# Patient Record
Sex: Female | Born: 1935 | Race: White | Hispanic: No | Smoking: Never smoker
Health system: Southern US, Community
[De-identification: ages and names within clinical notes are randomized; demographics above are authoritative.]

## PROBLEM LIST (undated history)

## (undated) DIAGNOSIS — I10 Essential (primary) hypertension: Secondary | ICD-10-CM

## (undated) DIAGNOSIS — C8300 Small cell B-cell lymphoma, unspecified site: Secondary | ICD-10-CM

## (undated) DIAGNOSIS — I4891 Unspecified atrial fibrillation: Secondary | ICD-10-CM

## (undated) DIAGNOSIS — I509 Heart failure, unspecified: Secondary | ICD-10-CM

## (undated) DIAGNOSIS — E785 Hyperlipidemia, unspecified: Secondary | ICD-10-CM

## (undated) HISTORY — DX: Essential (primary) hypertension: I10

## (undated) HISTORY — DX: Unspecified atrial fibrillation: I48.91

## (undated) HISTORY — DX: Small cell B-cell lymphoma, unspecified site: C83.00

## (undated) HISTORY — DX: Hyperlipidemia, unspecified: E78.5

## (undated) HISTORY — DX: Heart failure, unspecified: I50.9

## (undated) HISTORY — PX: GALLBLADDER SURGERY: SHX652

---

## 2006-11-03 ENCOUNTER — Other Ambulatory Visit: Payer: Self-pay

## 2006-11-03 ENCOUNTER — Inpatient Hospital Stay: Payer: Self-pay | Admitting: Internal Medicine

## 2006-11-04 ENCOUNTER — Encounter: Payer: Self-pay | Admitting: Cardiovascular Disease

## 2006-11-06 ENCOUNTER — Other Ambulatory Visit: Payer: Self-pay

## 2006-11-07 ENCOUNTER — Encounter: Payer: Self-pay | Admitting: Cardiovascular Disease

## 2007-01-30 ENCOUNTER — Ambulatory Visit: Payer: Self-pay | Admitting: Internal Medicine

## 2007-03-30 ENCOUNTER — Emergency Department: Payer: Self-pay | Admitting: Emergency Medicine

## 2008-02-03 ENCOUNTER — Ambulatory Visit: Payer: Self-pay | Admitting: Internal Medicine

## 2009-10-27 ENCOUNTER — Encounter: Payer: Self-pay | Admitting: Cardiovascular Disease

## 2009-11-10 ENCOUNTER — Encounter: Payer: Self-pay | Admitting: Cardiovascular Disease

## 2009-11-14 ENCOUNTER — Encounter: Payer: Self-pay | Admitting: Cardiovascular Disease

## 2009-11-18 DIAGNOSIS — I4891 Unspecified atrial fibrillation: Secondary | ICD-10-CM

## 2009-11-18 DIAGNOSIS — I2510 Atherosclerotic heart disease of native coronary artery without angina pectoris: Secondary | ICD-10-CM | POA: Insufficient documentation

## 2009-11-18 DIAGNOSIS — I10 Essential (primary) hypertension: Secondary | ICD-10-CM | POA: Insufficient documentation

## 2009-11-18 DIAGNOSIS — E785 Hyperlipidemia, unspecified: Secondary | ICD-10-CM

## 2009-12-01 ENCOUNTER — Encounter: Payer: Self-pay | Admitting: Cardiovascular Disease

## 2009-12-02 ENCOUNTER — Ambulatory Visit: Payer: Self-pay | Admitting: Cardiovascular Disease

## 2009-12-06 ENCOUNTER — Encounter: Payer: Self-pay | Admitting: Cardiovascular Disease

## 2009-12-06 LAB — CONVERTED CEMR LAB: Prothrombin Time: 27.6 s

## 2010-01-03 ENCOUNTER — Telehealth: Payer: Self-pay | Admitting: Cardiovascular Disease

## 2010-01-03 ENCOUNTER — Encounter: Payer: Self-pay | Admitting: Cardiovascular Disease

## 2010-01-10 ENCOUNTER — Encounter: Payer: Self-pay | Admitting: Cardiovascular Disease

## 2010-01-11 ENCOUNTER — Telehealth: Payer: Self-pay | Admitting: Cardiovascular Disease

## 2010-01-11 ENCOUNTER — Encounter: Payer: Self-pay | Admitting: Cardiovascular Disease

## 2010-01-11 LAB — CONVERTED CEMR LAB: POC INR: 2.1

## 2010-01-26 ENCOUNTER — Encounter: Payer: Self-pay | Admitting: Cardiovascular Disease

## 2010-01-27 ENCOUNTER — Encounter: Payer: Self-pay | Admitting: Cardiovascular Disease

## 2010-02-06 ENCOUNTER — Encounter (INDEPENDENT_AMBULATORY_CARE_PROVIDER_SITE_OTHER): Payer: Self-pay

## 2010-02-08 ENCOUNTER — Encounter: Payer: Self-pay | Admitting: Cardiovascular Disease

## 2010-02-17 ENCOUNTER — Encounter: Payer: Self-pay | Admitting: Cardiovascular Disease

## 2010-02-20 ENCOUNTER — Encounter: Payer: Self-pay | Admitting: Cardiovascular Disease

## 2010-03-03 ENCOUNTER — Encounter: Payer: Self-pay | Admitting: Cardiovascular Disease

## 2010-03-07 ENCOUNTER — Encounter: Payer: Self-pay | Admitting: Cardiovascular Disease

## 2010-03-07 ENCOUNTER — Telehealth: Payer: Self-pay | Admitting: Cardiovascular Disease

## 2010-03-15 ENCOUNTER — Telehealth: Payer: Self-pay | Admitting: Cardiovascular Disease

## 2010-03-17 ENCOUNTER — Encounter (INDEPENDENT_AMBULATORY_CARE_PROVIDER_SITE_OTHER): Payer: Self-pay | Admitting: *Deleted

## 2010-03-21 ENCOUNTER — Encounter: Payer: Self-pay | Admitting: Cardiovascular Disease

## 2010-03-23 ENCOUNTER — Telehealth: Payer: Self-pay | Admitting: Cardiovascular Disease

## 2010-04-12 ENCOUNTER — Encounter: Payer: Self-pay | Admitting: Cardiovascular Disease

## 2010-05-29 ENCOUNTER — Encounter: Payer: Self-pay | Admitting: Cardiovascular Disease

## 2010-06-06 ENCOUNTER — Ambulatory Visit: Payer: Self-pay | Admitting: Internal Medicine

## 2010-07-03 ENCOUNTER — Ambulatory Visit: Payer: Self-pay | Admitting: Cardiovascular Disease

## 2010-10-03 NOTE — Medication Information (Signed)
Summary: Coumadin Clinic  Anticoagulant Therapy  Managed by: Inactive Referring MD: Rockey Situ PCP: Melina Modena, MD Supervising MD: Rockey Situ Indication 1: Atrial Fibrillation Lab Used: [facility] [place] Site: [place] INR RANGE 2.0-3.0          Comments: Pt is now having coumadin monitored at her primary MD Dr Milagros Evener office. Freddrick March RN  April 12, 2010 2:11 PM   Allergies: 1)  ! Pcn  Anticoagulation Management History:      Positive risk factors for bleeding include an age of 75 years or older.  The bleeding index is 'intermediate risk'.  Positive CHADS2 values include History of HTN.  Negative CHADS2 values include Age > 67 years old.  Anticoagulation responsible provider: [place].    Anticoagulation Management Assessment/Plan:      The patient's current anticoagulation dose is Warfarin sodium 6 mg tabs: Use as directed by Anticoagulation Clinic, Warfarin sodium 1 mg tabs: take as directed.  The target INR is 2.0-3.0.  The next INR is due 03/21/2010.  Anticoagulation instructions were given to patient.  Results were reviewed/authorized by Inactive.         Prior Anticoagulation Instructions: INR 2.6  Continue taking 3mg  daily except 2mg  on Monday and Friday. Recheck in 2 weeks. Attempted to call pt with results. LMOM TCB Cordelia Pen, RN  March 07, 2010 12:16 PM   Called and spoke to pt, she is aware of results dose of coumadin recheck in 2 weeks. Pt does not wish to have her coumadin checked here in office.  Asked her to call PCP Dr. Arline Asp to take over her coumadin monitoring.  She will call his office and let us know so we can fax our records to them.

## 2010-10-03 NOTE — Progress Notes (Signed)
Summary: PHI  PHI   Imported By: Zenovia Jarred 12/05/2009 13:36:48  _____________________________________________________________________  External Attachment:    Type:   Image     Comment:   External Document

## 2010-10-03 NOTE — Medication Information (Signed)
Summary: Coumadin Clinic  Anticoagulant Therapy  Managed by: Freddrick March, RN, BSN Referring MD: Rockey Situ PCP: Melina Modena, MD Supervising MD: Rockey Situ Indication 1: Atrial Fibrillation Lab Used: [facility] [place] Site: [place] PT 22.5 INR POC 2.1 INR RANGE 2.0-3.0    Bleeding/hemorrhagic complications: no     Any changes in medication regimen? no     Any missed doses?: no        Comments: Pt states she has been taking 4mg  daily, since resumed after skipped doses.    Allergies: 1)  ! Pcn  Anticoagulation Management History:      Her anticoagulation is being managed by telephone today.  Positive risk factors for bleeding include an age of 75 years or older.  The bleeding index is 'intermediate risk'.  Positive CHADS2 values include History of HTN.  Negative CHADS2 values include Age > 3 years old.  Prothrombin time is 22.5.  Anticoagulation responsible provider: Makena Murdock.  INR POC: 2.1.    Anticoagulation Management Assessment/Plan:      The patient's current anticoagulation dose is Warfarin sodium 6 mg tabs: Use as directed by Anticoagulation Clinic.  The target INR is 2.0-3.0.  The next INR is due 01/25/2010.  Anticoagulation instructions were given to patient.  Results were reviewed/authorized by Freddrick March, RN, BSN.  She was notified by Freddrick March RN.         Prior Anticoagulation Instructions: INR 6.4  Spoke with pt.  Advised to hold coumadin x 3 doses, then decrease dosage to 4mg  daily except 3mg  on Mondays and Thursdays.  Recheck in 1 week.    Current Anticoagulation Instructions: INR 2.1  Attempted to call pt with results.  N/A Freddrick March RN  Jan 11, 2010 3:47 PM  Spoke with pt advised to start taking 4mg  daily except 3mg  on Mondays and Fridays.  Recheck in 2 weeks.

## 2010-10-03 NOTE — Medication Information (Signed)
Summary: Coumadin Clinic  Anticoagulant Therapy  Managed by: Freddrick March, RN, BSN Referring MD: Rockey Situ PCP: Melina Modena, MD Supervising MD: Rockey Situ Indication 1: Atrial Fibrillation Lab Used: [facility] [place] Site: [place] PT 67.9 INR POC 6.4 INR RANGE 2.0-3.0  Dietary changes: no    Health status changes: no    Bleeding/hemorrhagic complications: yes       Details: Incr bruising, no signs of sctive bleeding.  Recent/future hospitalizations: no    Any changes in medication regimen? no    Recent/future dental: no  Any missed doses?: no       Is patient compliant with meds? yes      Comments: Pt denies any recent illness, or medication changes.  No significant changes in pt's diet per pt report.    Allergies: 1)  ! Pcn  Anticoagulation Management History:      Her anticoagulation is being managed by telephone today.  Positive risk factors for bleeding include an age of 75 years or older.  The bleeding index is 'intermediate risk'.  Positive CHADS2 values include History of HTN.  Negative CHADS2 values include Age > 55 years old.  Prothrombin time is 67.9.  Anticoagulation responsible provider: Meredith Mells.  INR POC: 6.4.    Anticoagulation Management Assessment/Plan:      The patient's current anticoagulation dose is Warfarin sodium 6 mg tabs: Use as directed by Anticoagulation Clinic.  The target INR is 2.0-3.0.  The next INR is due 01/10/2010.  Anticoagulation instructions were given to patient.  Results were reviewed/authorized by Freddrick March, RN, BSN.  She was notified by Freddrick March RN.         Prior Anticoagulation Instructions: The patient is to continue with the same dose of coumadin.  This dosage includes: 4 mg daily  Current Anticoagulation Instructions: INR 6.4  Spoke with pt.  Advised to hold coumadin x 3 doses, then decrease dosage to 4mg  daily except 3mg  on Mondays and Thursdays.  Recheck in 1 week.

## 2010-10-03 NOTE — Letter (Signed)
Summary: Medical Record Release  Medical Record Release   Imported By: Zenovia Jarred 11/25/2009 08:38:50  _____________________________________________________________________  External Attachment:    Type:   Image     Comment:   External Document

## 2010-10-03 NOTE — Progress Notes (Signed)
Summary: Results  Phone Note Call from Patient Call back at Home Phone [phone]   Caller: Patient Call For: RN Summary of Call: Patient called wanting to get her results from her [place]. Initial call taken by: Shanda Howells,  March 07, 2010 2:48 PM  Follow-up for Phone Call        pt called and coumadin dosed  Follow-up by: Cordelia Pen, RN,  March 07, 2010 4:21 PM

## 2010-10-03 NOTE — Progress Notes (Signed)
Summary: Simcor  Phone Note Outgoing Call   Call placed by: Cordelia Pen, RN,  March 23, 2010 9:06 AM Call placed to: Patient Summary of Call: Insurance company will not pay for simcor any longer Dr. Rockey Situ wants to change to simvastatin 40mg  daily. LMOM TCB Cordelia Pen, RN  March 23, 2010 9:07 AM      Appended Document: [facility]    Clinical Lists Changes  Medications: Changed medication from [facility] 500-20 MG XR24H-TAB (NIACIN-SIMVASTATIN) 2 by mouth once daily to SIMVASTATIN 40 MG TABS (SIMVASTATIN) Take 1 tablet by mouth once a day - Signed Rx of SIMVASTATIN 40 MG TABS (SIMVASTATIN) Take 1 tablet by mouth once a day;  #30 x 6;  Signed;  Entered by: Cordelia Pen, RN;  Authorized by: Esmond Plants MD;  Method used: Electronically to [facility] [address] [address] [place], [place], [place]  [address], Ph: [phone], Fax: [phone]    Prescriptions: SIMVASTATIN 40 MG TABS (SIMVASTATIN) Take 1 tablet by mouth once a day  #30 x 6   Entered by:   Cordelia Pen, RN   Authorized by:   Esmond Plants MD   Signed by:   Cordelia Pen, RN on 03/24/2010   Method used:   Electronically to        [facility]retail)       [address]       [place], [place]  [address]       Ph: [phone]       Fax: [phone]   RxID:   [id number]

## 2010-10-03 NOTE — Progress Notes (Signed)
Summary: COUMADIN  Phone Note Call from Patient Call back at Home Phone [phone]   Caller: SELF Call For: [facility] Summary of Call: [place] [facility] Initial call taken by: Zenovia Jarred,  Jan 11, 2010 4:04 PM

## 2010-10-03 NOTE — Letter (Signed)
Summary: [facility]   Imported By: Sallee Provencal 07/11/2010 15:55:24  _____________________________________________________________________  External Attachment:    Type:   Image     Comment:   External Document

## 2010-10-03 NOTE — Consult Note (Signed)
Summary: [facility]   Imported By: Sallee Provencal 07/11/2010 15:52:24  _____________________________________________________________________  External Attachment:    Type:   Image     Comment:   External Document

## 2010-10-03 NOTE — Medication Information (Signed)
Summary: Coumadin Clinic  Anticoagulant Therapy  Managed by: Freddrick March, RN, BSN Referring MD: Rockey Situ PCP: Melina Modena, MD Supervising MD: Rockey Situ Indication 1: Atrial Fibrillation Lab Used: [facility] [place] Site: [place] PT 55.7 INR POC 5.2 INR RANGE 2.0-3.0  Dietary changes: no    Health status changes: no    Bleeding/hemorrhagic complications: no    Recent/future hospitalizations: no    Any changes in medication regimen? no    Recent/future dental: no  Any missed doses?: no       Is patient compliant with meds? yes       Allergies: 1)  ! Pcn  Anticoagulation Management History:      Positive risk factors for bleeding include an age of 75 years or older.  The bleeding index is 'intermediate risk'.  Positive CHADS2 values include History of HTN.  Negative CHADS2 values include Age > 75 years old.  Prothrombin time is 55.7.  Anticoagulation responsible provider: Aubre Quincy.  INR POC: 5.2.    Anticoagulation Management Assessment/Plan:      The patient's current anticoagulation dose is Warfarin sodium 6 mg tabs: Use as directed by Anticoagulation Clinic.  The target INR is 2.0-3.0.  The next INR is due 02/06/2010.  Anticoagulation instructions were given to patient.  Results were reviewed/authorized by Freddrick March, RN, BSN.  She was notified by Mignon Pine, RMA.         Prior Anticoagulation Instructions: INR 2.1  Attempted to call pt with results.  N/A Freddrick March RN  Jan 11, 2010 3:47 PM  Spoke with pt advised to start taking 4mg  daily except 3mg  on Mondays and Fridays.  Recheck in 2 weeks.    Current Anticoagulation Instructions: INR 5.2  Per Dr Rockey Situ -- hold 3 days then start taking 3mg s daily recheck in 10 days (02/06/10)

## 2010-10-03 NOTE — Assessment & Plan Note (Signed)
Summary: NP6/AMD   Visit Type:  New Patient Referring Provider:  Rockey Situ Primary Provider:  Melina Modena, MD  CC:  no complaints.  History of Present Illness: Maria Mosley is a very pleasant 77 year old woman, known to me from [facility], patient of Dr. Arline Asp, with a history of coronary artery disease, subtotally occluded LAD that fills via collaterals from her right coronary artery as well as 40% proximal RCA lesion with ejection fraction 50-55% in July 2008 who presents to reestablish care.  She states that she stopped isosorbide mononitrate and amlodipine on her own. The reasons are uncertain. She has felt well, exercises almost on a daily basis. She is uncertain what her blood pressures are though she thinks they are less than [id number] systolic at home.she denies any significant shortness of breath, chest pain, near syncope. She is able to perform well on a treadmill.  she is uncertain what her cholesterol is so states that she was told it was very good. Last labs from February 2010  showed total cholesterol 154, HDL 50, LDL 80.  Preventive Screening-Counseling & Management  Alcohol-Tobacco     Alcohol drinks/day: 0     Smoking Status: never  Caffeine-Diet-Exercise     Caffeine use/day: occassionally     Does Patient Exercise: yes      Drug Use:  no.    Current Problems (verified): 1)  Atrial Fibrillation  (ICD-427.31) 2)  Hyperlipidemia-mixed  (ICD-272.4) 3)  Hypertension, Benign  (ICD-401.1) 4)  Cad, Native Vessel  (ICD-414.01)  Current Medications (verified): 1)  Carvedilol 6.25 Mg Tabs (Carvedilol) .... Take One and A Half Tablets By Mouth Twice A Day 2)  Lisinopril-Hydrochlorothiazide 20-12.5 Mg Tabs (Lisinopril-Hydrochlorothiazide) .Marland Kitchen.. 1 By Mouth Once Daily 3)  Simcor 500-20 Mg Xr24h-Tab (Niacin-Simvastatin) .... 2 By Mouth Once Daily 4)  Digoxin 0.125 Mg Tabs (Digoxin) .... Take One Tablet By Mouth Daily 5)  Warfarin Sodium 6 Mg Tabs (Warfarin Sodium)  .... Use As Directed By Anticoagulation Clinic 6)  Fenofibrate 160 Mg Tabs (Fenofibrate) .Marland Kitchen.. 1 By Mouth Once Daily 7)  Fish Oil Maximum Strength 1200 Mg Caps (Omega-3 Fatty Acids) .Marland Kitchen.. 1 By Mouth Once Daily 8)  Aspirin 81 Mg Tbec (Aspirin) .... Take One Tablet By Mouth Daily 9)  Calcium 600 1500 Mg Tabs (Calcium Carbonate) .Marland Kitchen.. 1 By Mouth Once Daily  Allergies (verified): 1)  ! Pcn  Past History:  Past Medical History: Last updated: 11/18/2009 Atrial Fibrillation CAD Hyperlipidemia Hypertension  Past Surgical History: gall bladder  Family History: Father: deceased 90: unknown Mother: decesaed 66: heart attack Brother: living: parkinson's disease  Social History: Retired  Widowed  Tobacco Use - No.  Alcohol Use - no Regular Exercise - yes Drug Use - no Alcohol drinks/day:  0 Smoking Status:  never Caffeine use/day:  occassionally Does Patient Exercise:  yes Drug Use:  no  Review of Systems  The patient denies fever, weight loss, weight gain, vision loss, decreased hearing, hoarseness, chest pain, syncope, dyspnea on exertion, peripheral edema, prolonged cough, abdominal pain, incontinence, muscle weakness, depression, and enlarged lymph nodes.    Vital Signs:  Patient profile:   77 year old female Height:      65 inches Weight:      169.50 pounds BMI:     28.31 Pulse rate:   66 / minute Pulse rhythm:   regular BP sitting:   150 / 96  (left arm) Cuff size:   large  Vitals Entered By: Philemon Kingdom (December 02, 2009 10:51 AM)  Physical Exam  General:  well-appearing woman in no apparent distress, HEENT exam is benign, neck is supple with no JVP or carotid bruits, heart sounds are regular with S1-S2 and no murmurs appreciated, lungs are clear to auscultation with no wheezes Rales, abdominal exam is benign, no significant lower extremity edema, neurologic exam is grossly nonfocal, skin is warm and dry.  pulses are equal and symmetrical in her upper and lower  extremities.   New Orders:     1)  T-Protime, Auto [id number])   EKG  Procedure date:  12/02/2009  Findings:      normal sinus rhythm with rate 66 beats per minute, incomplete left bundle branch block.  Impression & Recommendations:  Problem # 1:  HYPERTENSION, BENIGN (ICD-401.1) Ms. Forker stopped her amlodipine and Imdur and currently her blood pressure is elevated. Repeat blood pressures confirmed systolics of [id number]. We have suggested that she increase her lisinopril HCT  to 40/25 mg daily. I have asked her to monitor her blood pressure at home. If it continues to be elevated, we may need to restart amlodipine 5 mg daily.   Her updated medication list for this problem includes:    Carvedilol 6.25 Mg Tabs (Carvedilol) .Marland Kitchen... Take one and a half tablets by mouth twice a day    Lisinopril-hydrochlorothiazide 20-12.5 Mg Tabs (Lisinopril-hydrochlorothiazide) .Marland Kitchen... 2 by mouth daily    Aspirin 81 Mg Tbec (Aspirin) .Marland Kitchen... Take one tablet by mouth daily  Problem # 2:  HYPERLIPIDEMIA-MIXED (ICD-272.4) We will try to obtain her most recent cholesterol from Dr. Arline Asp. Ideally her goal LDL should be less than 70.  Her updated medication list for this problem includes:    Simcor 500-20 Mg Xr24h-tab (Niacin-simvastatin) .Marland Kitchen... 2 by mouth once daily    Fenofibrate 160 Mg Tabs (Fenofibrate) .Marland Kitchen... 1 by mouth once daily  Problem # 3:  CAD, NATIVE VESSEL (ICD-414.01) Recent catheterization in 2008 showing subtotally occluded LAD, collaterals from her RCA also with proximal RCA disease. Currently with no symptoms and exercising daily with no suggestion of ischemia. As she is asymptomatic, we will hold off on having her complete any stress testing at this time. We can discuss this with her on her next visit.  Her updated medication list for this problem includes:    Carvedilol 6.25 Mg Tabs (Carvedilol) .Marland Kitchen... Take one and a half tablets by mouth twice a day    Lisinopril-hydrochlorothiazide 20-12.5 Mg Tabs  (Lisinopril-hydrochlorothiazide) .Marland Kitchen... 2 by mouth daily    Warfarin Sodium 6 Mg Tabs (Warfarin sodium) ..... Use as directed by anticoagulation clinic    Aspirin 81 Mg Tbec (Aspirin) .Marland Kitchen... Take one tablet by mouth daily  Other Orders: T-Protime, Auto [id number])  Patient Instructions: 1)  Your physician recommends that you schedule a follow-up appointment in: 6 months 2)  Your physician recommends that you return for lab work as scheduled.   3)  Your physician has recommended you make the following change in your medication: increase lisinopril-hct to 40/25 mg daily Prescriptions: LISINOPRIL-HYDROCHLOROTHIAZIDE 20-12.5 MG TABS (LISINOPRIL-HYDROCHLOROTHIAZIDE) 2 by mouth daily  #60 x 3   Entered by:   Gabriel Cirri, RN, BSN   Authorized by:   Esmond Plants MD   Signed by:   Gabriel Cirri, RN, BSN on 12/02/2009   Method used:   Electronically to        [facility] ([facility]       [address]  [place], [place]  [address]       Ph: [phone]       Fax: [phone]   RxID:   [id number]

## 2010-10-03 NOTE — Miscellaneous (Signed)
  Clinical Lists Changes  Medications: Added new medication of CARVEDILOL 6.25 MG TABS (CARVEDILOL) Take one and a half tablets by mouth twice a day - Signed Rx of CARVEDILOL 6.25 MG TABS (CARVEDILOL) Take one and a half tablets by mouth twice a day;  #90 x 1;  Signed;  Entered by: Gabriel Cirri, RN, BSN;  Authorized by: Esmond Plants MD;  Method used: Electronically to [facility] [address], [place], [place], [place]  [address], Ph: [phone], Fax: [phone]    Prescriptions: CARVEDILOL 6.25 MG TABS (CARVEDILOL) Take one and a half tablets by mouth twice a day  #90 x 1   Entered by:   Gabriel Cirri, RN, BSN   Authorized by:   Esmond Plants MD   Signed by:   Gabriel Cirri, RN, BSN on 10/27/2009   Method used:   Electronically to        [facility]       [address]       [place], [place]  [address]       Ph: [phone]       Fax: [phone]   RxID:   [id number]

## 2010-10-03 NOTE — Progress Notes (Signed)
Summary: order for coumadin  Phone Note Call from Patient   Summary of Call: Pt. states PCP, Dr. Arline Asp at [facility] will be doing her PT for her but they will need an order faxed to them.  Fax number [phone].  Pts phone is  [phone]. Initial call taken by: Luana Shu,  March 15, 2010 10:41 AM  Follow-up for Phone Call        I spoke with the pt and explained we will send an order to Dr. Milagros Evener offic for her. Follow-up by: Alvis Lemmings, RN, BSN,  March 17, 2010 10:23 AM

## 2010-10-03 NOTE — Miscellaneous (Signed)
Summary: Orders Update  Clinical Lists Changes Order faxed to [place], [place]  February 17, 2010 10:35 AM  Orders: Added new Test order of T-Protime, Auto [phone]) - Signed

## 2010-10-03 NOTE — Miscellaneous (Signed)
Summary: Refill Warfarin  Clinical Lists Changes  Medications: Added new medication of WARFARIN SODIUM 1 MG TABS (WARFARIN SODIUM) take as directed - Signed Rx of WARFARIN SODIUM 1 MG TABS (WARFARIN SODIUM) take as directed;  #30 x 1;  Signed;  Entered by: Dolores Lory, CMA;  Authorized by: Esmond Plants MD;  Method used: [facility]ically to [facility] [address] [address] [place], [place], [place]  [address], Ph: [phone], Fax: [phone]    Prescriptions: WARFARIN SODIUM 1 MG TABS (WARFARIN SODIUM) take as directed  #30 x 1   Entered by:   Dolores Lory, CMA   Authorized by:   Esmond Plants MD   Signed by:   Dolores Lory, CMA on 03/21/2010   Method used:   Electronically to        [facility]       [address]       [place], [place]  [address]       Ph: [phone]       Fax: [phone]   RxID:   [id number]

## 2010-10-03 NOTE — Progress Notes (Signed)
Summary: Critical Lab  Phone Note From Other Clinic   Caller: [place] Call For: DR. Rockey Situ Summary of Call: [facility] called with critical lab on patient.  INR- 6.4 and PT- 67.9 Initial call taken by: Shanda Howells,  Jan 03, 2010 3:32 PM  Follow-up for Phone Call        Called spoke with pt.  Lab results addressed see EMR coumadin note.  Follow-up by: Freddrick March RN,  Jan 03, 2010 5:10 PM

## 2010-10-03 NOTE — Assessment & Plan Note (Signed)
Summary: F/U 6 MONTHS   Visit Type:  Follow-up Referring Provider:  Rockey Situ Primary Provider:  Melina Modena, MD  CC:  Has been under a lot of stress with a friend and two family members passed away recently.  Denies chest pain or shortness of breath..  History of Present Illness: Maria Mosley is a very pleasant 77 year old woman, patient of Dr. Arline Asp, with a history of coronary artery disease, subtotally occluded LAD that fills via collaterals from her right coronary artery as well as 40% proximal RCA lesion with ejection fraction 50-55% in July 2008 who presents 4 routine followup.  She reports that overall, she has been doing well. She reports that her blood pressure is well controlled at home. She had recent blood work which she provides to [place] today from Dr. Milagros Evener office. Overall she has no complaints and is relatively active with no chest pain or shortness of breath.  we discussed her history of atrial fibrillation and these details are not available to [place] and her current records. We'll try to obtain the notes from [facility] though she reports no history of palpitation or tachycardia for several years.  labs from September 26 of this year shows total cholesterol 140, LDL 70, HDL 35, normal LFTs  EKG shows normal sinus rhythm with rate 62 beats per minute, nonspecific ST changes in leads V3 through V6, 2, 3, aVF  Current Medications (verified): 1)  Carvedilol 6.25 Mg Tabs (Carvedilol) .... Take One and A Half Tablets By Mouth Twice A Day 2)  Lisinopril-Hydrochlorothiazide 20-12.5 Mg Tabs (Lisinopril-Hydrochlorothiazide) .... 2 By Mouth Daily 3)  Simvastatin 40 Mg Tabs (Simvastatin) .... Take 1 Tablet By Mouth Once A Day 4)  Digoxin 0.125 Mg Tabs (Digoxin) .... Take One Tablet By Mouth Daily 5)  Warfarin Sodium 6 Mg Tabs (Warfarin Sodium) .... Use As Directed By Anticoagulation Clinic 6)  Fenofibrate 160 Mg Tabs (Fenofibrate) .Marland Kitchen.. 1 By Mouth Once Daily 7)  Fish Oil Maximum Strength 1200 Mg Caps  (Omega-3 Fatty Acids) .Marland Kitchen.. 1 By Mouth Once Daily 8)  Aspirin 81 Mg Tbec (Aspirin) .... Take One Tablet By Mouth Daily 9)  Calcium 600 1500 Mg Tabs (Calcium Carbonate) .Marland Kitchen.. 1 By Mouth Once Daily 10)  Warfarin Sodium 1 Mg Tabs (Warfarin Sodium) .... Take As Directed  Allergies (verified): 1)  ! Pcn  Past History:  Past Medical History: Last updated: 11/18/2009 Atrial Fibrillation CAD Hyperlipidemia Hypertension  Past Surgical History: Last updated: 12/28/2009 gall bladder  Family History: Last updated: 12/28/2009 Father: deceased 42: unknown Mother: decesaed 67: heart attack Brother: living: parkinson's disease  Social History: Last updated: 2009-12-28 Retired  Widowed  Tobacco Use - No.  Alcohol Use - no Regular Exercise - yes Drug Use - no  Risk Factors: Alcohol Use: 0 (December 28, 2009) Caffeine Use: occassionally (12-28-2009) Exercise: yes (December 28, 2009)  Risk Factors: Smoking Status: never (28-Dec-2009)  Review of Systems  The patient denies fever, weight loss, weight gain, vision loss, decreased hearing, hoarseness, chest pain, syncope, dyspnea on exertion, peripheral edema, prolonged cough, abdominal pain, incontinence, muscle weakness, depression, and enlarged lymph nodes.    Vital Signs:  Patient profile:   77 year old female Height:      65 inches Weight:      165 pounds BMI:     27.56  Vitals Entered By: Dolores Lory, [place] (July 03, 2010 3:15 PM)  Physical Exam  General:  well-appearing woman in no apparent distress, HEENT exam is benign, neck is supple with no JVP or carotid  bruits, heart sounds are regular with S1-S2 and no murmurs appreciated, lungs are clear to auscultation with no wheezes Rales, abdominal exam is benign, no significant lower extremity edema, neurologic exam is grossly nonfocal, skin is warm and dry.  pulses are equal and symmetrical in her upper and lower extremities.   Impression & Recommendations:  Problem # 1:  ATRIAL  FIBRILLATION (ICD-427.31) no recent episodes of atrial fibrillation and on her last several clinic visits she has been in normal sinus rhythm with no symptoms of tachycardia palpitations. I will try to retrieve the hospital notes from 4 years ago when she was at [facility] to see if we can hold her warfarin.  Her updated medication list for this problem includes:    Carvedilol 6.25 Mg Tabs (Carvedilol) .Marland Kitchen... Take one and a half tablets by mouth twice a day    Digoxin 0.125 Mg Tabs (Digoxin) .Marland Kitchen... Take one tablet by mouth daily    Warfarin Sodium 6 Mg Tabs (Warfarin sodium) ..... Use as directed by anticoagulation clinic    Aspirin 81 Mg Tbec (Aspirin) .Marland Kitchen... Take one tablet by mouth daily    Warfarin Sodium 1 Mg Tabs (Warfarin sodium) .Marland Kitchen... Take as directed  Problem # 2:  [id number] (P102836.4) Cholesterol is well controlled on her current medication regimen  Her updated medication list for this problem includes:    Simvastatin 40 Mg Tabs (Simvastatin) .Marland Kitchen... Take 1 tablet by mouth once a day    Fenofibrate 160 Mg Tabs (Fenofibrate) .Marland Kitchen... 1 by mouth once daily  Problem # 3:  CAD, NATIVE VESSEL (ICD-414.01) we'll continue aggressive lipid management. She is at goal with LDL less than 70. On her next lab check, she could have an expanded NMR lipoprofile performed to make sure that she is at goal.  Her updated medication list for this problem includes:    Carvedilol 6.25 Mg Tabs (Carvedilol) .Marland Kitchen... Take one and a half tablets by mouth twice a day    Lisinopril-hydrochlorothiazide 20-12.5 Mg Tabs (Lisinopril-hydrochlorothiazide) .Marland Kitchen... 2 by mouth daily    Warfarin Sodium 6 Mg Tabs (Warfarin sodium) ..... Use as directed by anticoagulation clinic    Aspirin 81 Mg Tbec (Aspirin) .Marland Kitchen... Take one tablet by mouth daily    Warfarin Sodium 1 Mg Tabs (Warfarin sodium) .Marland Kitchen... Take as directed  Orders: EKG w/ Interpretation (93000)  Problem # 4:  HYPERTENSION, BENIGN  (ICD-401.1) Blood pressure is well controlled on today's visit.  Her updated medication list for this problem includes:    Carvedilol 6.25 Mg Tabs (Carvedilol) .Marland Kitchen... Take one and a half tablets by mouth twice a day    Lisinopril-hydrochlorothiazide 20-12.5 Mg Tabs (Lisinopril-hydrochlorothiazide) .Marland Kitchen... 2 by mouth daily    Aspirin 81 Mg Tbec (Aspirin) .Marland Kitchen... Take one tablet by mouth daily  Appended Document: F/U 6 MONTHS Notes indicate an episode of atrial fibrillation in March 2008. She converted to normal sinus rhythm with IV Cardizem boluses. Blood pressure was noted to be elevated. She did have symptoms of shortness of breath with her atrial fibrillation. Echocardiogram at that time showed severely reduced LV systolic function, dilated right ventricle, moderate to severe mitral regurgitation.  Notes indicate that Coumadin was started given her low ejection fraction and risk of LV thrombus.  Cardiac cath showed EF of 55% in NSR.  given that she has had no further episodes in the past 3 years, we will hold her warfarin and watch her closely.

## 2010-10-03 NOTE — Letter (Signed)
Summary: Generic Doctor, general practice at [place], [place] [address]   Phone: [phone]  Fax: [phone]        March 17, 2010 MRN: [id number]    [address] [place], [place]  [address]    Attn: Dr. Arline Asp  The above patient no longer wishes to have her coumadin followed in our office due to the fact that we are no longer accepting lab draws from [facility]. She would like you to take over the drawing and dosing of her coumadin. Her records are enclosed. She is due to have her next PT/INR now. If you could please have someone from your office contact her to initiate this process we would greatly appreciate it.          Sincerely,   Timothy Gollan,MD Alvis Lemmings, RN, BSN   Appended Document: Generic Letter Faxed to Dr. Milagros Evener office at [phone].

## 2010-10-03 NOTE — Medication Information (Signed)
Summary: Coumadin Clinic  Anticoagulant Therapy  Managed by: Gabriel Cirri, RN, BSN Referring MD: Rockey Situ PCP: Melina Modena, MD Supervising MD: Rockey Situ Indication 1: Atrial Fibrillation Lab Used: [facility] Site: [place] PT 27.6 INR RANGE 2.0-3.0  Dietary changes: no    Health status changes: no    Bleeding/hemorrhagic complications: no    Recent/future hospitalizations: no    Any changes in medication regimen? no    Recent/future dental: no  Any missed doses?: no       Is patient compliant with meds? yes       Allergies: 1)  ! Pcn  Anticoagulation Management History:      Her anticoagulation is being managed by telephone today.  Positive risk factors for bleeding include an age of 75 years or older.  The bleeding index is 'intermediate risk'.  Positive CHADS2 values include History of HTN.  Negative CHADS2 values include Age > 55 years old.  Prothrombin time is 27.6.  Anticoagulation responsible provider: Madox Corkins.    Anticoagulation Management Assessment/Plan:      The patient's current anticoagulation dose is Warfarin sodium 6 mg tabs: Use as directed by Anticoagulation Clinic.  The target INR is 2.0-3.0.  The next INR is due 01/03/2010.  Anticoagulation instructions were given to patient.  Results were reviewed/authorized by Gabriel Cirri, RN, BSN.  She was notified by Gabriel Cirri, RN, BSN.         Current Anticoagulation Instructions: The patient is to continue with the same dose of coumadin.  This dosage includes: 4 mg daily

## 2010-12-04 ENCOUNTER — Encounter: Payer: Self-pay | Admitting: Cardiovascular Disease

## 2010-12-08 ENCOUNTER — Other Ambulatory Visit: Payer: Self-pay | Admitting: Cardiovascular Disease

## 2010-12-09 ENCOUNTER — Inpatient Hospital Stay: Payer: Self-pay | Admitting: Internal Medicine

## 2010-12-09 ENCOUNTER — Encounter: Payer: Self-pay | Admitting: Cardiovascular Disease

## 2010-12-11 ENCOUNTER — Telehealth: Payer: Self-pay | Admitting: Emergency Medicine

## 2010-12-11 NOTE — Telephone Encounter (Signed)
Pt went to the hospital over the weekend. 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 Pt saw PCP this morning and he said everything looks fine. Pt has been under a lot of stress due to family health problems. Pt is scheduled for 12/19/2010

## 2010-12-18 ENCOUNTER — Ambulatory Visit: Payer: Self-pay | Admitting: Cardiovascular Disease

## 2010-12-19 ENCOUNTER — Ambulatory Visit (INDEPENDENT_AMBULATORY_CARE_PROVIDER_SITE_OTHER): Payer: Medicare Other | Admitting: Cardiovascular Disease

## 2010-12-19 ENCOUNTER — Encounter: Payer: Self-pay | Admitting: Cardiovascular Disease

## 2010-12-19 DIAGNOSIS — E785 Hyperlipidemia, unspecified: Secondary | ICD-10-CM

## 2010-12-19 DIAGNOSIS — I10 Essential (primary) hypertension: Secondary | ICD-10-CM

## 2010-12-19 DIAGNOSIS — I4891 Unspecified atrial fibrillation: Secondary | ICD-10-CM

## 2010-12-19 DIAGNOSIS — I2510 Atherosclerotic heart disease of native coronary artery without angina pectoris: Secondary | ICD-10-CM

## 2010-12-19 NOTE — Assessment & Plan Note (Signed)
Blood pressure is well controlled on today's visit. No changes made to the medications. Her Coreg was decreased to 3.125 mg b.i.d. Blood pressure is relatively stable. We'll continue this dose for now/

## 2010-12-19 NOTE — Assessment & Plan Note (Signed)
Cholesterol is at goal on the current lipid regimen. No changes to the medications were made.  

## 2010-12-19 NOTE — Assessment & Plan Note (Signed)
Currently with no symptoms of angina. No further workup at this time. Continue current medication regimen. 

## 2010-12-19 NOTE — Patient Instructions (Addendum)
You are doing well. No medication changes were made. Increase water intake. Please call us if you have new issues that need to be addressed before your next appt.  We will call you for a follow up Appt in 6 months:

## 2010-12-19 NOTE — Progress Notes (Signed)
   Patient ID: Maria Mosley, female    DOB: March 24, 1933, 77 y.o.   MRN: [id number]  HPI Comments: [name] is a very pleasant 77 year old woman, patient of Dr. Arline Asp, with a history of coronary artery disease, subtotally occluded LAD that fills via collaterals from her right coronary artery as well as 40% proximal RCA lesion with ejection fraction 50-55% in July 2008 who presents for routine followup. She was recently admitted to the hospital on April 7 for near syncope, hypotension, dehydration and found to have a creatinine of 1.54.  She reports that she has had significant stress at home. The day of presentation, she was a work from being in the garden all day. She had dizziness while at church at choir practice. She was reports significant stress at home as her daughter is starting chemotherapy and radiation. She had just spent 6 days in the hospital with her daughter for workup. Her second daughter is sick and having her comfort care. Her son also is having surgery.  After discussion with the hospitalist, we decreased her digoxin to every other day, decreased her Coreg to 3.125 mg b.i.d..   Currently she feels well with no complaints. she reports no history of palpitation or tachycardia for several years.   Recent blood work from last week shows total cholesterol 144, LDL 73, HDL 30   EKG shows normal sinus rhythm with rate 62 beats per minute, Nonspecific interventricular conduction delay, nonspecific ST changes in anterolateral leads, inferior leads      Review of Systems  Constitutional: Negative.   HENT: Negative.   Eyes: Negative.   Respiratory: Negative.   Cardiovascular: Negative.   Gastrointestinal: Negative.   Musculoskeletal: Negative.   Skin: Negative.   Neurological: Negative.   Hematological: Negative.   Psychiatric/Behavioral: The patient is nervous/anxious.        Tearful during interaction  All other systems reviewed and are negative.   BP 140/86  Pulse 68  Ht 5'  6" (1.676 m)  Wt 171 lb (77.565 kg)  BMI 27.60 kg/m2   Physical Exam  Nursing note and vitals reviewed. Constitutional: She is oriented to person, place, and time. She appears well-developed and well-nourished.  HENT:  Head: Normocephalic.  Nose: Nose normal.  Mouth/Throat: Oropharynx is clear and moist.  Eyes: Conjunctivae are normal. Pupils are equal, round, and reactive to light.  Neck: Normal range of motion. Neck supple. No JVD present.  Cardiovascular: Normal rate, regular rhythm, normal heart sounds and intact distal pulses.  Exam reveals no gallop and no friction rub.   No murmur heard. Pulmonary/Chest: Effort normal and breath sounds normal. No respiratory distress. She has no wheezes. She has no rales. She exhibits no tenderness.  Abdominal: Soft. Bowel sounds are normal. She exhibits no distension. There is no tenderness.  Musculoskeletal: Normal range of motion. She exhibits no edema and no tenderness.  Lymphadenopathy:    She has no cervical adenopathy.  Neurological: She is alert and oriented to person, place, and time. Coordination normal.  Skin: Skin is warm and dry. No rash noted. No erythema.  Psychiatric: She has a normal mood and affect. Her behavior is normal. Judgment and thought content normal.         Assessment and Plan

## 2010-12-19 NOTE — Assessment & Plan Note (Signed)
We did decrease her digoxin as her level was mildly elevated on 0.125 mg daily. We have changed this to every other day while she was in the hospital.

## 2011-01-24 ENCOUNTER — Other Ambulatory Visit: Payer: Self-pay | Admitting: Cardiovascular Disease

## 2011-03-27 ENCOUNTER — Other Ambulatory Visit: Payer: Self-pay | Admitting: Cardiovascular Disease

## 2011-03-29 ENCOUNTER — Telehealth: Payer: Self-pay

## 2011-03-29 MED ORDER — FENOFIBRATE 160 MG PO TABS: 160.0000 mg | ORAL_TABLET | Freq: Every day | ORAL | Status: DC

## 2011-03-29 NOTE — Telephone Encounter (Signed)
Needs a refill for fenofibrate 160 mg take one tablet twice a day.

## 2011-06-02 ENCOUNTER — Other Ambulatory Visit: Payer: Self-pay | Admitting: Cardiovascular Disease

## 2011-06-04 ENCOUNTER — Telehealth: Payer: Self-pay

## 2011-06-04 MED ORDER — SIMVASTATIN 40 MG PO TABS: 40.0000 mg | ORAL_TABLET | Freq: Every day | ORAL | Status: DC

## 2011-06-04 NOTE — Telephone Encounter (Signed)
Refill sent to pharmacy for simvastatin.

## 2011-06-07 ENCOUNTER — Other Ambulatory Visit: Payer: Self-pay | Admitting: Cardiovascular Disease

## 2011-06-07 NOTE — Telephone Encounter (Signed)
Refill sent for lisinopril hctz

## 2011-07-18 ENCOUNTER — Ambulatory Visit: Payer: Self-pay | Admitting: Internal Medicine

## 2011-08-01 ENCOUNTER — Other Ambulatory Visit: Payer: Self-pay | Admitting: Cardiovascular Disease

## 2011-08-01 NOTE — Telephone Encounter (Signed)
Refill sent for fenofibrate.

## 2011-12-23 ENCOUNTER — Other Ambulatory Visit: Payer: Self-pay | Admitting: Cardiovascular Disease

## 2011-12-24 ENCOUNTER — Other Ambulatory Visit: Payer: Self-pay | Admitting: Cardiovascular Disease

## 2011-12-24 MED ORDER — LISINOPRIL-HYDROCHLOROTHIAZIDE 20-12.5 MG PO TABS: 2.0000 | ORAL_TABLET | Freq: Every day | ORAL | Status: DC

## 2011-12-24 NOTE — Telephone Encounter (Signed)
Please refill.

## 2011-12-24 NOTE — Telephone Encounter (Signed)
Refilled lisinopril

## 2012-01-12 ENCOUNTER — Other Ambulatory Visit: Payer: Self-pay | Admitting: Cardiovascular Disease

## 2012-01-14 ENCOUNTER — Other Ambulatory Visit: Payer: Self-pay | Admitting: *Deleted

## 2012-01-14 MED ORDER — CARVEDILOL 6.25 MG PO TABS: 6.2500 mg | ORAL_TABLET | Freq: Every day | ORAL | Status: DC

## 2012-01-14 MED ORDER — SIMVASTATIN 40 MG PO TABS: 40.0000 mg | ORAL_TABLET | Freq: Every day | ORAL | Status: DC

## 2012-01-14 NOTE — Telephone Encounter (Signed)
Refilled Coreg and Zocor.

## 2012-02-11 ENCOUNTER — Encounter: Payer: Self-pay | Admitting: Cardiovascular Disease

## 2012-02-11 ENCOUNTER — Ambulatory Visit (INDEPENDENT_AMBULATORY_CARE_PROVIDER_SITE_OTHER): Payer: Medicare Other | Admitting: Cardiovascular Disease

## 2012-02-11 VITALS — BP 110/78 | HR 66 | Resp 16 | Ht 67.0 in | Wt 182.8 lb

## 2012-02-11 DIAGNOSIS — E785 Hyperlipidemia, unspecified: Secondary | ICD-10-CM

## 2012-02-11 DIAGNOSIS — I10 Essential (primary) hypertension: Secondary | ICD-10-CM

## 2012-02-11 DIAGNOSIS — I2510 Atherosclerotic heart disease of native coronary artery without angina pectoris: Secondary | ICD-10-CM

## 2012-02-11 DIAGNOSIS — I4891 Unspecified atrial fibrillation: Secondary | ICD-10-CM

## 2012-02-11 NOTE — Assessment & Plan Note (Signed)
Currently with no symptoms of angina. No further workup at this time. Continue current medication regimen. We will try to obtain her most recent lipid panel for our records.. ideally LDL should be less than 70.

## 2012-02-11 NOTE — Assessment & Plan Note (Signed)
We have encouraged her to stay on her current medications

## 2012-02-11 NOTE — Patient Instructions (Signed)
You are doing well. No medication changes were made.  Please call us if you have new issues that need to be addressed before your next appt.  Your physician wants you to follow-up in: 12 months.  You will receive a reminder letter in the mail two months in advance. If you don't receive a letter, please call our office to schedule the follow-up appointment. 

## 2012-02-11 NOTE — Assessment & Plan Note (Signed)
Maintaining normal sinus rhythm 

## 2012-02-11 NOTE — Progress Notes (Signed)
Patient ID: Maria Mosley, female    DOB: 01-24-1933, 78 y.o.   MRN: [id number]  HPI Comments: Maria Mosley is a very pleasant 78 year old woman, patient of Dr. Arline Asp, with a history of coronary artery disease, subtotally occluded LAD that fills via collaterals from her right coronary artery as well as 40% proximal RCA lesion with ejection fraction 50-55% in July 2008 who presents for routine followup. She was recently admitted to the hospital on April 7  for near syncope, hypotension, dehydration and found to have a creatinine of 1.54.  She reports that she has had a very difficult several months. Her daughter passed away from a reaction from chemotherapy. Stepdaughter also passed away. She has been doing well with no new complaints apart from weight gain.  She has not been exercising and has been eating more.  EKG shows normal sinus rhythm with rate 66 beats per minute, Nonspecific interventricular conduction delay, nonspecific ST changes in anterolateral leads    Outpatient Encounter Prescriptions as of 02/11/2012  Medication Sig Dispense Refill  . aspirin EC 81 MG EC tablet Take 81 mg by mouth daily.        . carvedilol (COREG) 6.25 MG tablet TAKE ONE AND ONE-HALF TABLETS BY MOUTH TWICE DAILY  90 tablet  5  . digoxin (LANOXIN) 0.125 MG tablet Take 1 tablet (125 mcg total) by mouth daily.  30 tablet  6  . fenofibrate 160 MG tablet TAKE ONE TABLET BY MOUTH ONE TIME DAILY  30 tablet  6  . lisinopril-hydrochlorothiazide (PRINZIDE,ZESTORETIC) 20-12.5 MG per tablet Take 2 tablets by mouth daily.  60 tablet  5  . Multiple Vitamin (MULTIVITAMIN) tablet Take 1 tablet by mouth daily.      . Omega-3 Fatty Acids (FISH OIL) 1200 MG CAPS Take 1,200 mg by mouth daily.      . simvastatin (ZOCOR) 40 MG tablet Take 1 tablet (40 mg total) by mouth at bedtime.  30 tablet  3   Review of Systems  Constitutional: Negative.   HENT: Negative.   Eyes: Negative.   Respiratory: Negative.   Cardiovascular: Negative.     Gastrointestinal: Negative.   Musculoskeletal: Negative.   Skin: Negative.   Neurological: Negative.   Hematological: Negative.   Psychiatric/Behavioral:       Tearful during interaction  All other systems reviewed and are negative.   BP 110/78  Pulse 66  Resp 16  Ht 5\' 7"  (1.702 m)  Wt 182 lb 12.8 oz (82.918 kg)  BMI 28.63 kg/m2  Physical Exam  Nursing note and vitals reviewed. Constitutional: She is oriented to person, place, and time. She appears well-developed and well-nourished.  HENT:  Head: Normocephalic.  Nose: Nose normal.  Mouth/Throat: Oropharynx is clear and moist.  Eyes: Conjunctivae are normal. Pupils are equal, round, and reactive to light.  Neck: Normal range of motion. Neck supple. No JVD present.  Cardiovascular: Normal rate, regular rhythm, S1 normal, S2 normal, normal heart sounds and intact distal pulses.  Exam reveals no gallop and no friction rub.   No murmur heard. Pulmonary/Chest: Effort normal and breath sounds normal. No respiratory distress. She has no wheezes. She has no rales. She exhibits no tenderness.  Abdominal: Soft. Bowel sounds are normal. She exhibits no distension. There is no tenderness.  Musculoskeletal: Normal range of motion. She exhibits no edema and no tenderness.  Lymphadenopathy:    She has no cervical adenopathy.  Neurological: She is alert and oriented to person, place, and time. Coordination normal.  Skin: Skin is warm and dry. No rash noted. No erythema.  Psychiatric: She has a normal mood and affect. Her behavior is normal. Judgment and thought content normal.         Assessment and Plan

## 2012-02-11 NOTE — Assessment & Plan Note (Signed)
Blood pressure is well controlled on today's visit. No changes made to the medications. 

## 2012-02-29 ENCOUNTER — Other Ambulatory Visit: Payer: Self-pay | Admitting: Cardiovascular Disease

## 2012-03-17 ENCOUNTER — Other Ambulatory Visit: Payer: Self-pay | Admitting: Cardiovascular Disease

## 2012-03-17 NOTE — Telephone Encounter (Signed)
Refilled Fenofibrate.

## 2012-05-20 ENCOUNTER — Other Ambulatory Visit: Payer: Self-pay | Admitting: Cardiovascular Disease

## 2012-05-20 NOTE — Telephone Encounter (Signed)
Refilled Simvastatin.

## 2012-07-09 ENCOUNTER — Other Ambulatory Visit: Payer: Self-pay | Admitting: Cardiovascular Disease

## 2012-07-09 NOTE — Telephone Encounter (Signed)
Refilled Lisinopril. 

## 2012-08-07 ENCOUNTER — Telehealth: Payer: Self-pay | Admitting: Cardiovascular Disease

## 2012-08-07 NOTE — Telephone Encounter (Signed)
I called Dan back at [facility], asking if ok to refill pt's digoxin with a diff. manufacturer. Advised this is ok

## 2012-08-07 NOTE — Telephone Encounter (Signed)
Needs to discuss pt digoxin.

## 2012-08-13 ENCOUNTER — Ambulatory Visit: Payer: Self-pay | Admitting: Internal Medicine

## 2012-09-11 ENCOUNTER — Other Ambulatory Visit: Payer: Self-pay | Admitting: Cardiovascular Disease

## 2012-09-11 NOTE — Telephone Encounter (Signed)
Refill sent for digoxin. 

## 2012-09-17 ENCOUNTER — Other Ambulatory Visit: Payer: Self-pay | Admitting: Cardiovascular Disease

## 2012-09-17 NOTE — Telephone Encounter (Signed)
Refilled Fenofibrate.

## 2012-11-03 ENCOUNTER — Other Ambulatory Visit: Payer: Self-pay | Admitting: Cardiovascular Disease

## 2012-12-19 ENCOUNTER — Other Ambulatory Visit: Payer: Self-pay | Admitting: Cardiovascular Disease

## 2013-02-10 ENCOUNTER — Ambulatory Visit (INDEPENDENT_AMBULATORY_CARE_PROVIDER_SITE_OTHER): Payer: Medicare Other | Admitting: Cardiovascular Disease

## 2013-02-10 ENCOUNTER — Encounter: Payer: Self-pay | Admitting: Cardiovascular Disease

## 2013-02-10 VITALS — BP 158/92 | HR 60 | Ht 67.0 in | Wt 169.8 lb

## 2013-02-10 DIAGNOSIS — I4891 Unspecified atrial fibrillation: Secondary | ICD-10-CM

## 2013-02-10 DIAGNOSIS — I10 Essential (primary) hypertension: Secondary | ICD-10-CM

## 2013-02-10 DIAGNOSIS — I2510 Atherosclerotic heart disease of native coronary artery without angina pectoris: Secondary | ICD-10-CM

## 2013-02-10 DIAGNOSIS — E785 Hyperlipidemia, unspecified: Secondary | ICD-10-CM

## 2013-02-10 NOTE — Assessment & Plan Note (Signed)
Remote history of atrial fibrillation. She reports this was in the setting of significant stress. Maintaining normal sinus rhythm. We'll continue low-dose digoxin.

## 2013-02-10 NOTE — Patient Instructions (Addendum)
You are doing well. No medication changes were made.  Please call us if you have new issues that need to be addressed before your next appt.  Your physician wants you to follow-up in: 12 months.  You will receive a reminder letter in the mail two months in advance. If you don't receive a letter, please call our office to schedule the follow-up appointment. 

## 2013-02-10 NOTE — Progress Notes (Signed)
Patient ID: Maria Mosley, female    DOB: 1933/05/15, 79 y.o.   MRN: [id number]  HPI Comments: Maria Mosley is a very pleasant 79 year old woman, patient of Dr. Arline Asp, with a history of coronary artery disease, subtotally occluded LAD that fills via collaterals from her right coronary artery as well as 40% proximal RCA lesion with ejection fraction 50-55% in July 2008 who presents for routine followup.  Prior history of atrial fibrillation noted prior to 2011, started on digoxin for rhythm control.   previous admission to the hospital for  near syncope, hypotension, dehydration and found to have a creatinine of 1.54. Last year she  had a difficult year.  Her daughter passed away from a reaction from chemotherapy. Stepdaughter also passed away.  She has been doing well with no new complaints apart from weight gain.  She has not been exercising and has been eating more, as on her last visit. Several recent stressors including loss of neighbors, attending several funerals. Blood pressure at home typically runs in the 130 range over 70s. Recently started on isosorbide last week. No side effects.  Labs from may 2014 : Total cholesterol 108, LDL 47, HDL 23, normal LFTs, creatinine 1.3 with BUN 29  EKG shows normal sinus rhythm with rate 60 beats per minute, Nonspecific interventricular conduction delay, nonspecific ST changes in anterolateral leads, seen previously    Outpatient Encounter Prescriptions as of 02/10/2013  Medication Sig Dispense Refill  . aspirin EC 81 MG EC tablet Take 81 mg by mouth daily.        . carvedilol (COREG) 6.25 MG tablet TAKE ONE AND ONE-HALF TABLETS BY MOUTH TWICE DAILY  90 tablet  4  . DIGOX 0.125 MG tablet TAKE ONE TABLET BY MOUTH ONE TIME DAILY  30 tablet  4  . fenofibrate 160 MG tablet TAKE ONE TABLET BY MOUTH ONE TIME DAILY  30 tablet  4  . isosorbide mononitrate (IMDUR) 30 MG 24 hr tablet Take 30 mg by mouth daily.      Marland Kitchen lisinopril-hydrochlorothiazide  (PRINZIDE,ZESTORETIC) 20-12.5 MG per tablet Take 1 tablet by mouth 2 (two) times daily.       . Multiple Vitamin (MULTIVITAMIN) tablet Take 1 tablet by mouth daily.      . Omega-3 Fatty Acids (FISH OIL) 1200 MG CAPS Take 1,200 mg by mouth daily.      . simvastatin (ZOCOR) 40 MG tablet TAKE ONE TABLET BY MOUTH   NIGHTLY AT BEDTIME  30 tablet  3    Review of Systems  Constitutional: Negative.   HENT: Negative.   Eyes: Negative.   Respiratory: Negative.   Cardiovascular: Negative.   Gastrointestinal: Negative.   Musculoskeletal: Negative.   Skin: Negative.   Neurological: Negative.   Psychiatric/Behavioral: Negative.        Tearful during interaction  All other systems reviewed and are negative.   BP 158/92  Pulse 60  Ht 5\' 7"  (1.702 m)  Wt 169 lb 12 oz (76.998 kg)  BMI 26.58 kg/m2  Physical Exam  Nursing note and vitals reviewed. Constitutional: She is oriented to person, place, and time. She appears well-developed and well-nourished.  HENT:  Head: Normocephalic.  Nose: Nose normal.  Mouth/Throat: Oropharynx is clear and moist.  Eyes: Conjunctivae are normal. Pupils are equal, round, and reactive to light.  Neck: Normal range of motion. Neck supple. No JVD present.  Cardiovascular: Normal rate, regular rhythm, S1 normal, S2 normal, normal heart sounds and intact distal pulses.  Exam reveals  no gallop and no friction rub.   No murmur heard. Pulmonary/Chest: Effort normal and breath sounds normal. No respiratory distress. She has no wheezes. She has no rales. She exhibits no tenderness.  Abdominal: Soft. Bowel sounds are normal. She exhibits no distension. There is no tenderness.  Musculoskeletal: Normal range of motion. She exhibits no edema and no tenderness.  Lymphadenopathy:    She has no cervical adenopathy.  Neurological: She is alert and oriented to person, place, and time. Coordination normal.  Skin: Skin is warm and dry. No rash noted. No erythema.  Psychiatric: She  has a normal mood and affect. Her behavior is normal. Judgment and thought content normal.    Assessment and Plan

## 2013-02-10 NOTE — Assessment & Plan Note (Signed)
Cholesterol is at goal on the current lipid regimen. No changes to the medications were made.  

## 2013-02-10 NOTE — Assessment & Plan Note (Signed)
Currently with no symptoms of angina. No further workup at this time. Continue current medication regimen. 

## 2013-02-10 NOTE — Assessment & Plan Note (Signed)
Blood pressure is well controlled on today's visit. No changes made to the medications. 

## 2013-04-03 ENCOUNTER — Other Ambulatory Visit: Payer: Self-pay | Admitting: Cardiovascular Disease

## 2014-02-18 ENCOUNTER — Ambulatory Visit (INDEPENDENT_AMBULATORY_CARE_PROVIDER_SITE_OTHER): Payer: Medicare Other | Admitting: Cardiovascular Disease

## 2014-02-18 ENCOUNTER — Encounter: Payer: Self-pay | Admitting: Cardiovascular Disease

## 2014-02-18 VITALS — BP 150/90 | HR 61 | Ht 65.0 in | Wt 158.5 lb

## 2014-02-18 DIAGNOSIS — E785 Hyperlipidemia, unspecified: Secondary | ICD-10-CM

## 2014-02-18 DIAGNOSIS — I4891 Unspecified atrial fibrillation: Secondary | ICD-10-CM

## 2014-02-18 DIAGNOSIS — I2510 Atherosclerotic heart disease of native coronary artery without angina pectoris: Secondary | ICD-10-CM

## 2014-02-18 DIAGNOSIS — I10 Essential (primary) hypertension: Secondary | ICD-10-CM

## 2014-02-18 NOTE — Assessment & Plan Note (Signed)
Cholesterol is at goal on the current lipid regimen. No changes to the medications were made.  

## 2014-02-18 NOTE — Patient Instructions (Signed)
You are doing well. No medication changes were made.  Please call us if you have new issues that need to be addressed before your next appt.  Your physician wants you to follow-up in: 12 months.  You will receive a reminder letter in the mail two months in advance. If you don't receive a letter, please call our office to schedule the follow-up appointment. 

## 2014-02-18 NOTE — Progress Notes (Signed)
Patient ID: Maria Mosley, female    DOB: 02/12/1933, 80 y.o.   MRN: [id number]  HPI Comments: Maria Mosley is a very pleasant 80 year old woman, patient of Dr. Arline Asp, with a history of coronary artery disease, subtotally occluded LAD that fills via collaterals from her right coronary artery as well as 40% proximal RCA lesion with ejection fraction 50-55% in July 2008 who presents for routine followup.  Prior history of atrial fibrillation noted prior to 2011, started on digoxin for rhythm control. She presents for routine followup  Today she reports that she feels well. She is active, no falls, ounces good. Blood pressure at home typically 120s over 70. No new health concerns. She has had problems with depression, loss of her son 6 months ago. She now is alone, lost both of her children, even her stepdaughter  Her daughter passed away from a reaction from chemotherapy.   previous admission to the hospital for  near syncope, hypotension, dehydration and found to have a creatinine of 1.54.  Most recent blood work showing total cholesterol 115, LDL 48, HDL 24, creatinine 1.2  EKG shows normal sinus rhythm with rate 61 beats per minute, Nonspecific interventricular conduction delay, nonspecific ST changes in anterolateral leads, seen previously   Outpatient Encounter Prescriptions as of 02/18/2014  Medication Sig  . aspirin EC 81 MG EC tablet Take 81 mg by mouth daily.    . Calcium-Vitamin D 600-200 MG-UNIT per tablet Take by mouth.  . carvedilol (COREG) 6.25 MG tablet TAKE ONE AND ONE-HALF TABLETS BY MOUTH TWICE DAILY  . [place] 125 MCG tablet Take one tablet by mouth one time daily  . fenofibrate 160 MG tablet TAKE ONE TABLET BY MOUTH ONE TIME DAILY  . isosorbide mononitrate (IMDUR) 30 MG 24 hr tablet Take 30 mg by mouth daily.  Marland Kitchen lisinopril-hydrochlorothiazide (PRINZIDE,ZESTORETIC) 20-12.5 MG per tablet Take 1 tablet by mouth 2 (two) times daily.   . Multiple Vitamin (MULTIVITAMIN) tablet Take 1  tablet by mouth daily.  . Omega-3 Fatty Acids (FISH OIL) 1200 MG CAPS Take 1,200 mg by mouth daily.  . simvastatin (ZOCOR) 40 MG tablet TAKE ONE TABLET BY MOUTH   NIGHTLY AT BEDTIME     Review of Systems  Constitutional: Negative.   HENT: Negative.   Eyes: Negative.   Respiratory: Negative.   Cardiovascular: Negative.   Gastrointestinal: Negative.   Endocrine: Negative.   Musculoskeletal: Negative.   Skin: Negative.   Allergic/Immunologic: Negative.   Neurological: Negative.   Hematological: Negative.   Psychiatric/Behavioral: Negative.   All other systems reviewed and are negative.  BP 150/90  Pulse 61  Ht 5\' 5"  (1.651 m)  Wt 158 lb 8 oz (71.895 kg)  BMI 26.38 kg/m2  Physical Exam  Nursing note and vitals reviewed. Constitutional: She is oriented to person, place, and time. She appears well-developed and well-nourished.  HENT:  Head: Normocephalic.  Nose: Nose normal.  Mouth/Throat: Oropharynx is clear and moist.  Eyes: Conjunctivae are normal. Pupils are equal, round, and reactive to light.  Neck: Normal range of motion. Neck supple. No JVD present.  Cardiovascular: Normal rate, regular rhythm, S1 normal, S2 normal, normal heart sounds and intact distal pulses.  Exam reveals no gallop and no friction rub.   No murmur heard. Pulmonary/Chest: Effort normal and breath sounds normal. No respiratory distress. She has no wheezes. She has no rales. She exhibits no tenderness.  Abdominal: Soft. Bowel sounds are normal. She exhibits no distension. There is no tenderness.  Musculoskeletal:  Normal range of motion. She exhibits no edema and no tenderness.  Lymphadenopathy:    She has no cervical adenopathy.  Neurological: She is alert and oriented to person, place, and time. Coordination normal.  Skin: Skin is warm and dry. No rash noted. No erythema.  Psychiatric: She has a normal mood and affect. Her behavior is normal. Judgment and thought content normal.    Assessment and  Plan

## 2014-02-18 NOTE — Assessment & Plan Note (Signed)
Maintaining normal sinus rhythm. Continue current medications 

## 2014-02-18 NOTE — Assessment & Plan Note (Signed)
Blood pressure is well controlled on today's visit. No changes made to the medications. 

## 2014-02-18 NOTE — Assessment & Plan Note (Signed)
Currently with no symptoms of angina. No further workup at this time. Continue current medication regimen. 

## 2014-07-13 ENCOUNTER — Ambulatory Visit: Payer: Self-pay | Admitting: Internal Medicine

## 2015-02-18 ENCOUNTER — Encounter: Payer: Self-pay | Admitting: Cardiovascular Disease

## 2015-02-18 ENCOUNTER — Ambulatory Visit (INDEPENDENT_AMBULATORY_CARE_PROVIDER_SITE_OTHER): Payer: Medicare Other | Admitting: Cardiovascular Disease

## 2015-02-18 VITALS — BP 130/80 | HR 69 | Ht 64.0 in | Wt 157.0 lb

## 2015-02-18 DIAGNOSIS — E785 Hyperlipidemia, unspecified: Secondary | ICD-10-CM

## 2015-02-18 DIAGNOSIS — I10 Essential (primary) hypertension: Secondary | ICD-10-CM | POA: Diagnosis not present

## 2015-02-18 DIAGNOSIS — I4891 Unspecified atrial fibrillation: Secondary | ICD-10-CM | POA: Diagnosis not present

## 2015-02-18 DIAGNOSIS — I2510 Atherosclerotic heart disease of native coronary artery without angina pectoris: Secondary | ICD-10-CM

## 2015-02-18 NOTE — Assessment & Plan Note (Signed)
Maintaining normal sinus rhythm. Continue current medications 

## 2015-02-18 NOTE — Assessment & Plan Note (Signed)
Currently with no symptoms of angina. No further workup at this time. Continue current medication regimen. 

## 2015-02-18 NOTE — Progress Notes (Signed)
Patient ID: Maria Mosley, female    DOB: Aug 08, 1933, 81 y.o.   MRN: [id number]  HPI Comments: Maria Mosley is a very pleasant 81 year old woman, patient of Dr. Arline Asp, with a history of coronary artery disease, subtotally occluded LAD that fills via collaterals from her right coronary artery as well as 40% proximal RCA lesion with ejection fraction 50-55% in July 2008 who presents for routine followup of her coronary artery disease.  Prior history of atrial fibrillation noted prior to 2011, started on digoxin for rhythm control.   Today she reports that she feels well.  She is active,  No new health concerns. She has had problems with depression, loss of her son in 36, other family member earlier in 2016  She now is alone, lost both of her children, even her stepdaughter  Her daughter passed away from a reaction from chemotherapy.  She tries to stay active, would like to be more social  EKG on today's visit shows normal sinus rhythm with rate 69 bpm, nonspecific ST abnormality through the anterior precordial leads, PVCs  Other past medical history previous admission to the hospital for  near syncope, hypotension, dehydration and found to have a creatinine of 1.54.  Prior lab work; total cholesterol 115, LDL 48, HDL 24, creatinine 1.2   Allergies  Allergen Reactions  . Penicillins     Current Outpatient Prescriptions on File Prior to Visit  Medication Sig Dispense Refill  . aspirin EC 81 MG EC tablet Take 81 mg by mouth daily.      . Calcium-Vitamin D 600-200 MG-UNIT per tablet Take by mouth.    . carvedilol (COREG) 6.25 MG tablet TAKE ONE AND ONE-HALF TABLETS BY MOUTH TWICE DAILY 90 tablet 4  . DIGOX 125 MCG tablet Take one tablet by mouth one time daily 30 tablet 6  . fenofibrate 160 MG tablet TAKE ONE TABLET BY MOUTH ONE TIME DAILY 30 tablet 4  . isosorbide mononitrate (IMDUR) 30 MG 24 hr tablet Take 30 mg by mouth daily.    Marland Kitchen lisinopril-hydrochlorothiazide (PRINZIDE,ZESTORETIC)  20-12.5 MG per tablet Take 1 tablet by mouth 2 (two) times daily.     . Multiple Vitamin (MULTIVITAMIN) tablet Take 1 tablet by mouth daily.    . Omega-3 Fatty Acids (FISH OIL) 1200 MG CAPS Take 1,200 mg by mouth daily.    . simvastatin (ZOCOR) 40 MG tablet TAKE ONE TABLET BY MOUTH   NIGHTLY AT BEDTIME 30 tablet 3   No current facility-administered medications on file prior to visit.    Past Medical History  Diagnosis Date  . Hyperlipidemia   . Hypertension   . CHF (congestive heart failure)   . Atrial fibrillation     Past Surgical History  Procedure Laterality Date  . Gallbladder surgery      Social History  reports that she has never smoked. She has never used smokeless tobacco. She reports that she does not drink alcohol or use illicit drugs.  Family History family history includes Coronary artery disease in her other; Diabetes in her other; Heart attack in her father; Heart attack (age of onset: 67) in her son; Hyperlipidemia in her other; Hypertension in her other.   Review of Systems  Constitutional: Negative.   Respiratory: Negative.   Cardiovascular: Negative.   Gastrointestinal: Negative.   Musculoskeletal: Negative.   Skin: Negative.   Neurological: Negative.   Hematological: Negative.   Psychiatric/Behavioral: Negative.   All other systems reviewed and are negative.  BP 130/80 mmHg  Pulse 69  Ht 5\' 4"  (1.626 m)  Wt 157 lb (71.215 kg)  BMI 26.94 kg/m2  Physical Exam  Constitutional: She is oriented to person, place, and time. She appears well-developed and well-nourished.  HENT:  Head: Normocephalic.  Nose: Nose normal.  Mouth/Throat: Oropharynx is clear and moist.  Eyes: Conjunctivae are normal. Pupils are equal, round, and reactive to light.  Neck: Normal range of motion. Neck supple. No JVD present.  Cardiovascular: Normal rate, regular rhythm, S1 normal, S2 normal, normal heart sounds and intact distal pulses.  Exam reveals no gallop and no  friction rub.   No murmur heard. Pulmonary/Chest: Effort normal and breath sounds normal. No respiratory distress. She has no wheezes. She has no rales. She exhibits no tenderness.  Abdominal: Soft. Bowel sounds are normal. She exhibits no distension. There is no tenderness.  Musculoskeletal: Normal range of motion. She exhibits no edema or tenderness.  Lymphadenopathy:    She has no cervical adenopathy.  Neurological: She is alert and oriented to person, place, and time. Coordination normal.  Skin: Skin is warm and dry. No rash noted. No erythema.  Psychiatric: She has a normal mood and affect. Her behavior is normal. Judgment and thought content normal.    Assessment and Plan  Nursing note and vitals reviewed.

## 2015-02-18 NOTE — Patient Instructions (Addendum)
You are doing well. No medication changes were made.  Please call us if you have new issues that need to be addressed before your next appt.  Your physician wants you to follow-up in: 12 months.  You will receive a reminder letter in the mail two months in advance. If you don't receive a letter, please call our office to schedule the follow-up appointment. 

## 2015-02-18 NOTE — Assessment & Plan Note (Signed)
Blood pressure is well controlled on today's visit. No changes made to the medications. 

## 2015-02-18 NOTE — Assessment & Plan Note (Signed)
Cholesterol is at goal on the current lipid regimen. No changes to the medications were made.  

## 2015-04-04 ENCOUNTER — Telehealth: Payer: Self-pay | Admitting: *Deleted

## 2015-04-04 NOTE — Telephone Encounter (Signed)
Pt is calling stating pain in her foot, and very painful Began about 4 days ago Severe cramp left two toes When she puts pressure on this it gets bad.  Not sure if it is circulation or not  She said she called hours and hours ago before we opened and she has not heard anything from [place].  Please advise. She is in serious pain.    **stated to pt that she should call her PCP And or go to Urgent care, per Nurse [facility] for we won't be able to help as much as they would be able to She understood and stated thank you for checking and would go to urgent care**

## 2015-06-20 ENCOUNTER — Other Ambulatory Visit: Payer: Self-pay | Admitting: Internal Medicine

## 2015-06-20 DIAGNOSIS — Z1231 Encounter for screening mammogram for malignant neoplasm of breast: Secondary | ICD-10-CM

## 2015-07-15 ENCOUNTER — Ambulatory Visit
Admission: RE | Admit: 2015-07-15 | Discharge: 2015-07-15 | Disposition: A | Discharge status: Home / Self Care | Payer: Medicare Other | Source: Ambulatory Visit | Attending: Internal Medicine | Admitting: Internal Medicine

## 2015-07-15 ENCOUNTER — Other Ambulatory Visit: Payer: Self-pay | Admitting: Internal Medicine

## 2015-07-15 DIAGNOSIS — Z1231 Encounter for screening mammogram for malignant neoplasm of breast: Secondary | ICD-10-CM | POA: Diagnosis present

## 2015-07-15 DIAGNOSIS — R590 Localized enlarged lymph nodes: Secondary | ICD-10-CM | POA: Insufficient documentation

## 2015-07-19 ENCOUNTER — Other Ambulatory Visit: Payer: Self-pay | Admitting: Internal Medicine

## 2015-07-19 DIAGNOSIS — R590 Localized enlarged lymph nodes: Secondary | ICD-10-CM

## 2015-07-25 ENCOUNTER — Ambulatory Visit
Admission: RE | Admit: 2015-07-25 | Discharge: 2015-07-25 | Disposition: A | Discharge status: Home / Self Care | Payer: Medicare Other | Source: Ambulatory Visit | Attending: Internal Medicine | Admitting: Internal Medicine

## 2015-07-25 DIAGNOSIS — R599 Enlarged lymph nodes, unspecified: Secondary | ICD-10-CM | POA: Diagnosis present

## 2015-07-25 DIAGNOSIS — R590 Localized enlarged lymph nodes: Secondary | ICD-10-CM

## 2015-07-25 MED ORDER — IOHEXOL 300 MG/ML  SOLN
75.0000 mL | Freq: Once | INTRAMUSCULAR | Status: AC | PRN
  Administered 2015-07-25: 60 mL via INTRAVENOUS

## 2015-07-26 ENCOUNTER — Other Ambulatory Visit: Payer: Self-pay | Admitting: Internal Medicine

## 2015-07-26 DIAGNOSIS — R590 Localized enlarged lymph nodes: Secondary | ICD-10-CM

## 2015-10-19 ENCOUNTER — Ambulatory Visit
Admission: RE | Admit: 2015-10-19 | Discharge: 2015-10-19 | Disposition: A | Discharge status: Home / Self Care | Payer: Medicare Other | Source: Ambulatory Visit | Attending: Internal Medicine | Admitting: Internal Medicine

## 2015-10-19 DIAGNOSIS — R590 Localized enlarged lymph nodes: Secondary | ICD-10-CM

## 2015-10-19 DIAGNOSIS — J90 Pleural effusion, not elsewhere classified: Secondary | ICD-10-CM | POA: Diagnosis not present

## 2015-10-19 DIAGNOSIS — C8518 Unspecified B-cell lymphoma, lymph nodes of multiple sites: Secondary | ICD-10-CM | POA: Insufficient documentation

## 2015-10-19 LAB — POCT I-STAT CREATININE: CREATININE: 1.2 mg/dL — AB (ref 0.44–1.00)

## 2015-10-19 MED ORDER — IOHEXOL 300 MG/ML  SOLN
75.0000 mL | Freq: Once | INTRAMUSCULAR | Status: AC | PRN
  Administered 2015-10-19: 60 mL via INTRAVENOUS

## 2016-02-22 ENCOUNTER — Encounter: Payer: Self-pay | Admitting: Cardiovascular Disease

## 2016-02-22 ENCOUNTER — Ambulatory Visit (INDEPENDENT_AMBULATORY_CARE_PROVIDER_SITE_OTHER): Payer: Medicare Other | Admitting: Cardiovascular Disease

## 2016-02-22 ENCOUNTER — Encounter (INDEPENDENT_AMBULATORY_CARE_PROVIDER_SITE_OTHER): Payer: Self-pay

## 2016-02-22 VITALS — BP 150/80 | HR 54 | Ht 65.0 in | Wt 144.2 lb

## 2016-02-22 DIAGNOSIS — I10 Essential (primary) hypertension: Secondary | ICD-10-CM

## 2016-02-22 DIAGNOSIS — I2510 Atherosclerotic heart disease of native coronary artery without angina pectoris: Secondary | ICD-10-CM

## 2016-02-22 DIAGNOSIS — I4891 Unspecified atrial fibrillation: Secondary | ICD-10-CM

## 2016-02-22 DIAGNOSIS — C8300 Small cell B-cell lymphoma, unspecified site: Secondary | ICD-10-CM

## 2016-02-22 DIAGNOSIS — E785 Hyperlipidemia, unspecified: Secondary | ICD-10-CM | POA: Diagnosis not present

## 2016-02-22 DIAGNOSIS — C8510 Unspecified B-cell lymphoma, unspecified site: Secondary | ICD-10-CM | POA: Insufficient documentation

## 2016-02-22 MED ORDER — HYDROXYZINE HCL 25 MG PO TABS: 25.0000 mg | ORAL_TABLET | Freq: Three times a day (TID) | ORAL | Status: AC | PRN

## 2016-02-22 NOTE — Patient Instructions (Signed)
You are doing well.  For itch, Try calamine lotion, cortisone cream,  For pills, try hydroxyzine one pill up to three times a day  Please call us if you have new issues that need to be addressed before your next appt.  Your physician wants you to follow-up in: 6 months.  You will receive a reminder letter in the mail two months in advance. If you don't receive a letter, please call our office to schedule the follow-up appointment.

## 2016-02-22 NOTE — Progress Notes (Signed)
Patient ID: [name], female   DOB: Nov 15, 1932, 82 y.o.   MRN: [id number] Cardiology Office Note  Date:  02/22/2016   ID:  Maria Mosley, DOB [birth year], MRN [id number]  PCP:  Maria Aus, MD   Chief Complaint  Patient presents with  . other    1 yr f/u no complaints. Meds reviewed verbally.    HPI:  Maria Mosley is a very pleasant 82 year old woman, patient of Dr. Arline Asp, with a history of coronary artery disease, subtotally occluded LAD that fills via collaterals from her right coronary artery as well as 40% proximal RCA lesion with ejection fraction 50-55% in July 2008 who presents for routine followup of her coronary artery disease. Prior history of atrial fibrillation noted prior to 2011, started on digoxin for rhythm control.   Today she reports that she feels well.  Significant anxiety concerning diagnosis of lymphoma Significant itching, she attributes this to mosquito bites or bug bites Difficult time managing the itching  Previous problems with depression, loss of her son in 2015, other family member earlier in 2016  She  is alone, lost both of her children, even her stepdaughter Her daughter passed away from a reaction from chemotherapy.   She has indicated she does not want therapy for her lymphoma  EKG on today's visit shows normal sinus rhythm with rate 54 bpm, nonspecific ST abnormality through the anterior precordial leads, no significant change compared to prior EKGs  Other past medical history previous admission to the hospital for near syncope, hypotension, dehydration and found to have a creatinine of 1.54.  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  PSH:    Past Surgical History  Procedure Laterality Date  . Gallbladder surgery      Current Outpatient  Prescriptions  Medication Sig Dispense Refill  . aspirin EC 81 MG EC tablet Take 81 mg by mouth daily.      . Calcium-Vitamin D 600-200 MG-UNIT per tablet Take by mouth.    . carvedilol (COREG) 6.25 MG tablet TAKE ONE AND ONE-HALF TABLETS BY MOUTH TWICE DAILY 90 tablet 4  . DIGOX 125 MCG tablet Take one tablet by mouth one time daily 30 tablet 6  . fenofibrate 160 MG tablet TAKE ONE TABLET BY MOUTH ONE TIME DAILY 30 tablet 4  . isosorbide mononitrate (IMDUR) 30 MG 24 hr tablet Take 30 mg by mouth daily.    Marland Kitchen lisinopril-hydrochlorothiazide (PRINZIDE,ZESTORETIC) 20-12.5 MG per tablet Take 1 tablet by mouth 2 (two) times daily.     . Multiple Vitamin (MULTIVITAMIN) tablet Take 1 tablet by mouth daily.    . Omega-3 Fatty Acids (FISH OIL) 1200 MG CAPS Take 1,200 mg by mouth daily.    . simvastatin (ZOCOR) 40 MG tablet TAKE ONE TABLET BY MOUTH   NIGHTLY AT BEDTIME 30 tablet 3  . hydrOXYzine (ATARAX/VISTARIL) 25 MG tablet Take 1 tablet (25 mg total) by mouth 3 (three) times daily as needed. 30 tablet 0   No current facility-administered medications for this visit.     Allergies:   Bee venom and Penicillins   Social History:  The patient  reports that she has never smoked. She has never used smokeless tobacco. She reports that she does not drink alcohol or use illicit drugs.   Family History:   family history  includes Coronary artery disease in her other; Diabetes in her other; Heart attack in her father; Heart attack (age of onset: 20) in her son; Hyperlipidemia in her other; Hypertension in her other.    Review of Systems: Review of Systems  Constitutional: Negative.   Respiratory: Negative.   Cardiovascular: Negative.   Gastrointestinal: Negative.   Musculoskeletal: Negative.   Neurological: Negative.   Psychiatric/Behavioral: Negative.   All other systems reviewed and are negative.    PHYSICAL EXAM: VS:  BP 150/80 mmHg  Pulse 54  Ht 5\' 5"  (1.651 m)  Wt 144 lb 4 oz (65.431 kg)   BMI 24.00 kg/m2 , BMI Body mass index is 24 kg/(m^2). GEN: Well nourished, well developed, in no acute distress HEENT: normal Neck: no JVD, carotid bruits, or masses Cardiac: RRR; no murmurs, rubs, or gallops,no edema  Respiratory:  clear to auscultation bilaterally, normal work of breathing GI: soft, nontender, nondistended, + BS MS: no deformity or atrophy Skin: warm and dry, no rash Neuro:  Strength and sensation are intact Psych: euthymic mood, full affect    Recent Labs: 10/19/2015: Creatinine, Ser 1.20*    Lipid Panel No results found for: CHOL, HDL, LDLCALC, TRIG    Wt Readings from Last 3 Encounters:  02/22/16 144 lb 4 oz (65.431 kg)  02/18/15 157 lb (71.215 kg)  02/18/14 158 lb 8 oz (71.895 kg)       ASSESSMENT AND PLAN:  Atrial fibrillation, unspecified type ([place]) - Plan: EKG 12-Lead Maintaining normal sinus rhythm. We'll continue beta blocker, digoxin  Atherosclerosis of native coronary artery of native heart without angina pectoris Currently with no symptoms of angina. No further workup at this time. Continue current medication regimen.  Hyperlipidemia Cholesterol is at goal on the current lipid regimen. No changes to the medications were made.  HYPERTENSION, BENIGN Blood pressure borderline elevated. Anxious on today's visit rule out lymphoma Recommended she closely monitor blood pressure at home, call our office if this continues to run high  Small B-cell lymphoma, unspecified body region [facility]) She has indicated she does not want any treatment Previously seen other family members on chemotherapy, this scares her   Total encounter time more than 15 minutes  Greater than 50% was spent in counseling and coordination of care with the patient   Disposition:   F/U  6 months   Orders Placed This Encounter  Procedures  . EKG 12-Lead     Signed, Esmond Plants, M.D., Ph.D. 02/22/2016  [place], [place]

## 2016-07-05 ENCOUNTER — Other Ambulatory Visit: Payer: Self-pay | Admitting: Internal Medicine

## 2016-07-05 DIAGNOSIS — Z1231 Encounter for screening mammogram for malignant neoplasm of breast: Secondary | ICD-10-CM

## 2016-08-10 ENCOUNTER — Ambulatory Visit: Payer: Medicare Other

## 2016-08-17 ENCOUNTER — Ambulatory Visit (INDEPENDENT_AMBULATORY_CARE_PROVIDER_SITE_OTHER): Payer: Medicare Other | Admitting: Cardiovascular Disease

## 2016-08-17 ENCOUNTER — Encounter: Payer: Self-pay | Admitting: Cardiovascular Disease

## 2016-08-17 VITALS — BP 130/82 | HR 56 | Ht 65.0 in | Wt 138.8 lb

## 2016-08-17 DIAGNOSIS — C8300 Small cell B-cell lymphoma, unspecified site: Secondary | ICD-10-CM | POA: Diagnosis not present

## 2016-08-17 DIAGNOSIS — I10 Essential (primary) hypertension: Secondary | ICD-10-CM | POA: Diagnosis not present

## 2016-08-17 DIAGNOSIS — I2510 Atherosclerotic heart disease of native coronary artery without angina pectoris: Secondary | ICD-10-CM

## 2016-08-17 DIAGNOSIS — I4891 Unspecified atrial fibrillation: Secondary | ICD-10-CM | POA: Diagnosis not present

## 2016-08-17 DIAGNOSIS — R634 Abnormal weight loss: Secondary | ICD-10-CM

## 2016-08-17 DIAGNOSIS — D649 Anemia, unspecified: Secondary | ICD-10-CM

## 2016-08-17 DIAGNOSIS — E782 Mixed hyperlipidemia: Secondary | ICD-10-CM | POA: Diagnosis not present

## 2016-08-17 NOTE — Progress Notes (Signed)
Patient ID: Maria Mosley, female   DOB: 07/29/1933, 82 y.o.   MRN: [id number] Cardiology Office Note  Date:  08/17/2016   ID:  Maria Mosley, DOB Nov 18, 1932, MRN [id number]  PCP:  Rusty Aus, MD   Chief Complaint  Patient presents with  . other    6 month follow up. Meds reviewed by the pt. verbally. "doing well."     HPI:  Maria Mosley is a very pleasant 82 year old woman, patient of Dr. Arline Asp, with a history of coronary artery disease, subtotally occluded LAD that fills via collaterals from her right coronary artery as well as 40% proximal RCA lesion with ejection fraction 50-55% in July 2008 who presents for routine followup of her coronary artery disease. Prior history of atrial fibrillation noted prior to 2011, started on digoxin for rhythm control.   On today's visit she reports that she feels well Dr. Sabra Heck stopped fenofibrate (got sluggish) Cut the lisinopril HCT in 1/2 daily Cut the simvastatin in 1/2 daily Labs with Dr Sabra Heck Total chol 85, LDL 31, glu 108, creatinine 1.2, bun 27, HCT 35  diagnosis of lymphoma, she does not want therapy for her lymphoma weight dropping 6 pounds in 6 months  Previous problems with depression, loss of her son in 2015, other family member earlier in 2016  She  is alone, lost both of her children, even her stepdaughter Her daughter passed away from a reaction from chemotherapy.   EKG on today's visit shows normal sinus rhythm with rate 57 bpm, nonspecific ST abnormality through the anterior precordial leads, no significant change compared to prior EKGs  Other past medical history previous admission to the hospital for near syncope, hypotension, dehydration and found to have a creatinine of 1.54.  Prior lab work; total cholesterol 115, LDL 48, HDL 24, creatinine 1.2  PMH:   has a past medical history of Atrial fibrillation ([place]); CHF (congestive heart failure) ([place]); Hyperlipidemia; Hypertension; and Small cell B-cell lymphoma  ([place]).  PSH:    Past Surgical History:  Procedure Laterality Date  . GALLBLADDER SURGERY      Current Outpatient Prescriptions  Medication Sig Dispense Refill  . aspirin EC 81 MG EC tablet Take 81 mg by mouth daily.      . Calcium-Vitamin D 600-200 MG-UNIT per tablet Take by mouth.    . carvedilol (COREG) 6.25 MG tablet TAKE ONE AND ONE-HALF TABLETS BY MOUTH TWICE DAILY 90 tablet 4  . [place] 125 MCG tablet Take one tablet by mouth one time daily 30 tablet 6  . hydrOXYzine (ATARAX/VISTARIL) 25 MG tablet Take 1 tablet (25 mg total) by mouth 3 (three) times daily as needed. 30 tablet 0  . isosorbide mononitrate (IMDUR) 30 MG 24 hr tablet Take 30 mg by mouth daily.    [place] lisinopril-hydrochlorothiazide (PRINZIDE,ZESTORETIC) 20-12.5 MG tablet Take 1/2 tablet twice a day.    . Multiple Vitamin (MULTIVITAMIN) tablet Take 1 tablet by mouth daily.    . Omega-3 Fatty Acids (FISH OIL) 1200 MG CAPS Take 1,200 mg by mouth daily.    . simvastatin (ZOCOR) 40 MG tablet Take 20 mg by mouth daily.     No current facility-administered medications for this visit.      Allergies:   Bee venom and Penicillins   Social History:  The patient  reports that she has never smoked. She has never used smokeless tobacco. She reports that she does not drink alcohol or use drugs.   Family History:   family history  includes Coronary artery disease in her other; Diabetes in her other; Heart attack in her father; Heart attack (age of onset: 21) in her son; Hyperlipidemia in her other; Hypertension in her other.    Review of Systems: Review of Systems  Constitutional: Positive for weight loss.  Respiratory: Negative.   Cardiovascular: Negative.   Gastrointestinal: Negative.   Musculoskeletal: Negative.   Neurological: Negative.   Psychiatric/Behavioral: Negative.   All other systems reviewed and are negative.    PHYSICAL EXAM: VS:  BP 130/82 (BP Location: Left Arm, Patient Position: Sitting, Cuff Size: Normal)    Pulse (!) 56   Ht 5\' 5"  (1.651 m)   Wt 138 lb 12 oz (62.9 kg)   BMI 23.09 kg/m  , BMI Body mass index is 23.09 kg/m. GEN: in no acute distress , thin HEENT: normal  Neck: no JVD, carotid bruits, or masses Cardiac: RRR; no murmurs, rubs, or gallops,no edema  Respiratory:  clear to auscultation bilaterally, normal work of breathing GI: soft, nontender, nondistended, + BS MS: no deformity or atrophy  Skin: warm and dry, no rash Neuro:  Strength and sensation are intact Psych: euthymic mood, full affect    Recent Labs: 10/19/2015: Creatinine, Ser 1.20    Lipid Panel No results found for: CHOL, HDL, LDLCALC, TRIG    Wt Readings from Last 3 Encounters:  08/17/16 138 lb 12 oz (62.9 kg)  02/22/16 144 lb 4 oz (65.4 kg)  02/18/15 157 lb (71.2 kg)       ASSESSMENT AND PLAN:  Atrial fibrillation, unspecified type ([place]) - Plan: EKG 12-Lead Maintaining normal sinus rhythm.  We'll continue beta blocker, digoxin  Atherosclerosis of native coronary artery of native heart without angina pectoris Currently with no symptoms of angina. No further workup at this time. Continue current medication regimen.  Hyperlipidemia Pill cut in 1/2 , weight dropping 6 pounds  HYPERTENSION, BENIGN Medication cut in 1/2 Twice a day weight dropping Suggested if weight does not stabilize, we may need to cut the medication further  Small B-cell lymphoma, unspecified body region [facility]) she does not want any treatment  Anemia Recommended one iron pill a day  Weight loss 6 pounds   Total encounter time more than 25 minutes  Greater than 50% was spent in counseling and coordination of care with the patient   Disposition:   F/U  6 months   Orders Placed This Encounter  Procedures  . EKG 12-Lead     Signed, Esmond Plants, M.D., Ph.D. 08/17/2016  [place], [place]

## 2016-08-17 NOTE — Patient Instructions (Signed)
Medication Instructions:   Consider adding one iron pill a day for anemia Eat more, weight is dropping  Labwork:  No new labs needed  Testing/Procedures:  No further testing at this time   I recommend watching educational videos on topics of interest to you at:       [email or web address]  Enter code: HEARTCARE    Follow-Up: It was a pleasure seeing you in the office today. Please call us if you have new issues that need to be addressed before your next appt.  [phone]  Your physician wants you to follow-up in: 6 months.  You will receive a reminder letter in the mail two months in advance. If you don't receive a letter, please call our office to schedule the follow-up appointment.  If you need a refill on your cardiac medications before your next appointment, please call your pharmacy.

## 2016-09-06 ENCOUNTER — Ambulatory Visit
Admission: RE | Admit: 2016-09-06 | Discharge: 2016-09-06 | Disposition: A | Discharge status: Home / Self Care | Payer: Medicare Other | Source: Ambulatory Visit | Attending: Internal Medicine | Admitting: Internal Medicine

## 2016-09-06 DIAGNOSIS — R590 Localized enlarged lymph nodes: Secondary | ICD-10-CM | POA: Insufficient documentation

## 2016-09-06 DIAGNOSIS — Z1231 Encounter for screening mammogram for malignant neoplasm of breast: Secondary | ICD-10-CM | POA: Diagnosis present

## 2016-09-06 DIAGNOSIS — Z8572 Personal history of non-Hodgkin lymphomas: Secondary | ICD-10-CM | POA: Diagnosis not present

## 2017-02-18 ENCOUNTER — Ambulatory Visit (INDEPENDENT_AMBULATORY_CARE_PROVIDER_SITE_OTHER): Payer: Medicare Other | Admitting: Cardiovascular Disease

## 2017-02-18 ENCOUNTER — Encounter: Payer: Self-pay | Admitting: Cardiovascular Disease

## 2017-02-18 VITALS — BP 180/72 | HR 65 | Ht 65.0 in | Wt 130.5 lb

## 2017-02-18 DIAGNOSIS — I2510 Atherosclerotic heart disease of native coronary artery without angina pectoris: Secondary | ICD-10-CM | POA: Diagnosis not present

## 2017-02-18 DIAGNOSIS — I10 Essential (primary) hypertension: Secondary | ICD-10-CM

## 2017-02-18 DIAGNOSIS — E782 Mixed hyperlipidemia: Secondary | ICD-10-CM

## 2017-02-18 DIAGNOSIS — I4891 Unspecified atrial fibrillation: Secondary | ICD-10-CM | POA: Diagnosis not present

## 2017-02-18 MED ORDER — ISOSORBIDE MONONITRATE ER 30 MG PO TB24: 30.0000 mg | ORAL_TABLET | Freq: Two times a day (BID) | ORAL | 3 refills | Status: DC

## 2017-02-18 NOTE — Progress Notes (Signed)
Patient ID: Maria Mosley, female   DOB: [birth year], 83 y.o.   MRN: [id number] Cardiology Office Note  Date:  02/18/2017   ID:  Maria Mosley, DOB 10/04/1932, MRN [id number]  PCP:  Rusty Aus, MD   Chief Complaint  Patient presents with  . other    6 month follow up. Patient denies chest pain and SOB. Meds reviewed verbally with patient.     HPI:  Maria Mosley is a very pleasant 83 year old woman with a history of  coronary artery disease,  subtotally occluded LAD that fills via collaterals from her right coronary artery as well as 40% proximal RCA lesion with ejection fraction 50-55% in July 2008  atrial fibrillation noted prior to 2011, started on digoxin for rhythm control.  diagnosis of lymphoma, she does not want therapy for her lymphoma Previous problems with depression, loss of her son in 2015, who presents for routine followup of her coronary artery disease.   Having itching, wonders if from lymphoma Terrible mosquito bites causing severe dermatologic reaction, redness/erythema, blistering  BP high at home, wonders if it could be a combination of her lymphoma, stress, recent bug bites Otherwise active at baseline with no complaints Denies any chest pain concerning for angina Reports her weight is stable. We show weight is down additional 8 pounds since December 2017  EKG on today's visit shows normal sinus rhythm with rate 69 bpm, nonspecific ST abnormality through the anterior precordial leads, PVCs in a bigeminal pattern   Lab work reviewed with her in detail Total chol 105, LDL 41 CR normal  Other past medical history reviewed  Previous problems with depression, loss of her son in 22, other family member earlier in 2016  She  is alone, lost both of her children, even her stepdaughter Her daughter passed away from a reaction from chemotherapy.   previous admission to the hospital for near syncope, hypotension, dehydration and found to have a creatinine of  1.54.  Prior lab work; total cholesterol 115, LDL 48, HDL 24, creatinine 1.2  PMH:   has a past medical history of Atrial fibrillation ([place]); CHF (congestive heart failure) ([place]); Hyperlipidemia; Hypertension; and Small cell B-cell lymphoma ([place]).  PSH:    Past Surgical History:  Procedure Laterality Date  . GALLBLADDER SURGERY      Current Outpatient Prescriptions  Medication Sig Dispense Refill  . aspirin EC 81 MG EC tablet Take 81 mg by mouth daily.      . Calcium-Vitamin D 600-200 MG-UNIT per tablet Take by mouth.    . carvedilol (COREG) 6.25 MG tablet TAKE ONE AND ONE-HALF TABLETS BY MOUTH TWICE DAILY 90 tablet 4  . [place] 125 MCG tablet Take one tablet by mouth one time daily 30 tablet 6  . hydrOXYzine (ATARAX/VISTARIL) 25 MG tablet Take 1 tablet (25 mg total) by mouth 3 (three) times daily as needed. 30 tablet 0  . isosorbide mononitrate (IMDUR) 30 MG 24 hr tablet Take 1 tablet (30 mg total) by mouth 2 (two) times daily. 180 tablet 3  . lisinopril-hydrochlorothiazide (PRINZIDE,ZESTORETIC) 20-12.5 MG tablet Take 1/2 tablet twice a day.    . Multiple Vitamin (MULTIVITAMIN) tablet Take 1 tablet by mouth daily.    . Omega-3 Fatty Acids (FISH OIL) 1200 MG CAPS Take 1,200 mg by mouth daily.    . simvastatin (ZOCOR) 40 MG tablet Take 20 mg by mouth daily.     No current facility-administered medications for this visit.      Allergies:  Bee venom and Penicillins   Social History:  The patient  reports that she has never smoked. She has never used smokeless tobacco. She reports that she does not drink alcohol or use drugs.   Family History:   family history includes Coronary artery disease in her other; Diabetes in her other; Heart attack in her father; Heart attack (age of onset: 76) in her son; Hyperlipidemia in her other; Hypertension in her other.    Review of Systems: Review of Systems  Constitutional: Negative.   Respiratory: Negative.   Cardiovascular: Negative.    Gastrointestinal: Negative.   Musculoskeletal: Negative.   Skin: Positive for itching and rash.  Neurological: Negative.   Psychiatric/Behavioral: Negative.   All other systems reviewed and are negative.    PHYSICAL EXAM: VS:  BP (!) 180/72 (BP Location: Left Arm, Patient Position: Sitting, Cuff Size: Normal)   Pulse 65   Ht 5\' 5"  (1.651 m)   Wt 130 lb 8 oz (59.2 kg)   BMI 21.72 kg/m  , BMI Body mass index is 21.72 kg/m. GEN: in no acute distress , thin HEENT: normal  Neck: no JVD, carotid bruits, or masses Cardiac: RRR; no murmurs, rubs, or gallops,no edema  Respiratory:  clear to auscultation bilaterally, normal work of breathing GI: soft, nontender, nondistended, + BS MS: no deformity or atrophy , small region of erythema on her thumb with blistering Skin: warm and dry, no rash Neuro:  Strength and sensation are intact Psych: euthymic mood, full affect    Recent Labs: No results found for requested labs within last 8760 hours.    Lipid Panel No results found for: CHOL, HDL, LDLCALC, TRIG    Wt Readings from Last 3 Encounters:  02/18/17 130 lb 8 oz (59.2 kg)  08/17/16 138 lb 12 oz (62.9 kg)  02/22/16 144 lb 4 oz (65.4 kg)       ASSESSMENT AND PLAN:  Atrial fibrillation, unspecified type ([place]) - Plan: EKG 12-Lead Maintaining normal sinus rhythm. continue beta blocker  Atherosclerosis of native coronary artery of native heart without angina pectoris Currently with no symptoms of angina. No further workup at this time. Continue current medication regimen.  Hyperlipidemia Cholesterol at goal on her current medication regiment  HYPERTENSION, BENIGN She feels blood pressure is elevated at home on a regular basis Unable to give specific symptoms but she would like to change medications Recommended she take Imdur 30 mg twice a day  Small B-cell lymphoma, unspecified body region [facility]) she does not want any treatment  Anemia Blood count stable  Weight  loss Down additional 8 pounds from 6 months ago Recommended she increase her calorie intake   Total encounter time more than 25 minutes  Greater than 50% was spent in counseling and coordination of care with the patient   Disposition:   F/U  12 months   Orders Placed This Encounter  Procedures  . EKG 12-Lead     Signed, Esmond Plants, M.D., Ph.D. 02/18/2017  [place], [place]

## 2017-02-18 NOTE — Patient Instructions (Addendum)
Medication Instructions:   Increase her isosorbide up to 30 mg twice a day Monitor blood pressure and call us next week if numbers continue to run high  Labwork:  No new labs needed  Testing/Procedures:  No further testing at this time   I recommend watching educational videos on topics of interest to you at:       [email or web address]  Enter code: HEARTCARE    Follow-Up: It was a pleasure seeing you in the office today. Please call us if you have new issues that need to be addressed before your next appt.  [phone]  Your physician wants you to follow-up in: 12 months.  You will receive a reminder letter in the mail two months in advance. If you don't receive a letter, please call our office to schedule the follow-up appointment.  If you need a refill on your cardiac medications before your next appointment, please call your pharmacy.

## 2018-01-28 ENCOUNTER — Encounter: Payer: Self-pay | Admitting: Emergency Medicine

## 2018-01-28 ENCOUNTER — Emergency Department
Admission: EM | Admit: 2018-01-28 | Discharge: 2018-01-28 | Disposition: A | Discharge status: Home / Self Care | Payer: Medicare Other | Attending: Student in an Organized Health Care Education/Training Program | Admitting: Student in an Organized Health Care Education/Training Program

## 2018-01-28 ENCOUNTER — Other Ambulatory Visit: Payer: Self-pay

## 2018-01-28 ENCOUNTER — Emergency Department: Payer: Medicare Other

## 2018-01-28 DIAGNOSIS — I110 Hypertensive heart disease with heart failure: Secondary | ICD-10-CM | POA: Insufficient documentation

## 2018-01-28 DIAGNOSIS — R531 Weakness: Secondary | ICD-10-CM

## 2018-01-28 DIAGNOSIS — J181 Lobar pneumonia, unspecified organism: Secondary | ICD-10-CM | POA: Diagnosis not present

## 2018-01-28 DIAGNOSIS — I509 Heart failure, unspecified: Secondary | ICD-10-CM | POA: Insufficient documentation

## 2018-01-28 DIAGNOSIS — Z79899 Other long term (current) drug therapy: Secondary | ICD-10-CM | POA: Diagnosis not present

## 2018-01-28 DIAGNOSIS — Z8572 Personal history of non-Hodgkin lymphomas: Secondary | ICD-10-CM | POA: Diagnosis not present

## 2018-01-28 DIAGNOSIS — J189 Pneumonia, unspecified organism: Secondary | ICD-10-CM

## 2018-01-28 DIAGNOSIS — Z7982 Long term (current) use of aspirin: Secondary | ICD-10-CM | POA: Insufficient documentation

## 2018-01-28 LAB — CBC WITH DIFFERENTIAL/PLATELET
BASOS PCT: 1 %
Basophils Absolute: 0 10*3/uL (ref 0–0.1)
Eosinophils Absolute: 0 10*3/uL (ref 0–0.7)
Eosinophils Relative: 1 %
HEMATOCRIT: 31.3 % — AB (ref 35.0–47.0)
HEMOGLOBIN: 10.7 g/dL — AB (ref 12.0–16.0)
Lymphocytes Relative: 17 %
Lymphs Abs: 1.4 10*3/uL (ref 1.0–3.6)
MCH: 30.7 pg (ref 26.0–34.0)
MCHC: 34.2 g/dL (ref 32.0–36.0)
MCV: 89.9 fL (ref 80.0–100.0)
MONO ABS: 0.9 10*3/uL (ref 0.2–0.9)
MONOS PCT: 10 %
NEUTROS ABS: 6.2 10*3/uL (ref 1.4–6.5)
NEUTROS PCT: 71 %
Platelets: 232 10*3/uL (ref 150–440)
RBC: 3.48 MIL/uL — ABNORMAL LOW (ref 3.80–5.20)
RDW: 12.4 % (ref 11.5–14.5)
WBC: 8.5 10*3/uL (ref 3.6–11.0)

## 2018-01-28 LAB — URINALYSIS, COMPLETE (UACMP) WITH MICROSCOPIC
BACTERIA UA: NONE SEEN
Bilirubin Urine: NEGATIVE
GLUCOSE, UA: NEGATIVE mg/dL
HGB URINE DIPSTICK: NEGATIVE
Ketones, ur: NEGATIVE mg/dL
LEUKOCYTES UA: NEGATIVE
NITRITE: NEGATIVE
PROTEIN: 30 mg/dL — AB
SPECIFIC GRAVITY, URINE: 1.011 (ref 1.005–1.030)
pH: 5 (ref 5.0–8.0)

## 2018-01-28 LAB — COMPREHENSIVE METABOLIC PANEL
ALBUMIN: 2.7 g/dL — AB (ref 3.5–5.0)
ALK PHOS: 137 U/L — AB (ref 38–126)
ALT: 16 U/L (ref 14–54)
ANION GAP: 10 (ref 5–15)
AST: 27 U/L (ref 15–41)
BILIRUBIN TOTAL: 0.9 mg/dL (ref 0.3–1.2)
BUN: 47 mg/dL — ABNORMAL HIGH (ref 6–20)
CALCIUM: 7.9 mg/dL — AB (ref 8.9–10.3)
CO2: 23 mmol/L (ref 22–32)
Chloride: 101 mmol/L (ref 101–111)
Creatinine, Ser: 1.08 mg/dL — ABNORMAL HIGH (ref 0.44–1.00)
GFR calc Af Amer: 54 mL/min — ABNORMAL LOW (ref >60)
GFR, EST NON AFRICAN AMERICAN: 46 mL/min — AB (ref >60)
GLUCOSE: 125 mg/dL — AB (ref 65–99)
POTASSIUM: 3.9 mmol/L (ref 3.5–5.1)
Sodium: 134 mmol/L — ABNORMAL LOW (ref 135–145)
TOTAL PROTEIN: 6.5 g/dL (ref 6.5–8.1)

## 2018-01-28 LAB — LACTIC ACID, PLASMA: LACTIC ACID, VENOUS: 0.9 mmol/L (ref 0.5–1.9)

## 2018-01-28 LAB — TROPONIN I

## 2018-01-28 MED ORDER — LEVOFLOXACIN IN D5W 750 MG/150ML IV SOLN
750.0000 mg | Freq: Once | INTRAVENOUS | Status: AC
  Administered 2018-01-28: 750 mg via INTRAVENOUS
  Filled 2018-01-28: qty 150

## 2018-01-28 MED ORDER — IPRATROPIUM-ALBUTEROL 0.5-2.5 (3) MG/3ML IN SOLN
3.0000 mL | Freq: Once | RESPIRATORY_TRACT | Status: AC
  Administered 2018-01-28: 3 mL via RESPIRATORY_TRACT
  Filled 2018-01-28: qty 9

## 2018-01-28 MED ORDER — SODIUM CHLORIDE 0.9 % IV BOLUS
500.0000 mL | Freq: Once | INTRAVENOUS | Status: AC
  Administered 2018-01-28: 500 mL via INTRAVENOUS

## 2018-01-28 MED ORDER — LEVOFLOXACIN 750 MG PO TABS: 750.0000 mg | ORAL_TABLET | Freq: Every day | ORAL | 0 refills | Status: AC

## 2018-01-28 NOTE — ED Notes (Signed)
Pt ambulated with dynamap. Sat level 95-96% maintained during ambulation. Pt denied SOB while ambulating.

## 2018-01-28 NOTE — ED Triage Notes (Signed)
Pt to ED from home with c/o of weakness. Pt has had a cold for approx 1 week. Pt has lymphoma. Pt also has c/o or right arm pain that she believes is related to injury.

## 2018-01-28 NOTE — ED Notes (Signed)
Discharge instructions reviewed with patient and her family. They verbalized understanding. PIV (to right posterior forearm) that was placed by EMS  removed by this RN. Patient offered wheelchair, refused. Patient ambulatory to lobby. Stable for discharge.

## 2018-01-28 NOTE — Discharge Instructions (Signed)
Return immediately if you develop any worsening fevers, weakness nausea vomiting, shortness of breath or for any additional questions or concerns.

## 2018-01-28 NOTE — ED Provider Notes (Signed)
[facility] Emergency Department Provider Note    First MD Initiated Contact with Patient 01/28/18 [phone]     (approximate)  I have reviewed the triage vital signs and the nursing notes.   HISTORY  Chief Complaint Weakness    HPI Maria Mosley is a 84 y.o. 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 fib presents to the ER with chief complaint of shortness of breath and primarily weakness and generalized malaise that got severe this morning.  States that she has had cold-like symptoms for the past week.  Nonproductive cough.  She denies any nausea or vomiting but states that her appetite has been decreased  Past Medical History:  Diagnosis Date  . Atrial fibrillation ([place])   . CHF (congestive heart failure) ([place])   . Hyperlipidemia   . Hypertension   . Small cell B-cell lymphoma (HCC)    Family History  Problem Relation Age of Onset  . Heart attack Father   . Heart attack Son 88  . Coronary artery disease Other   . Diabetes Other   . Hyperlipidemia Other   . Hypertension Other    Past Surgical History:  Procedure Laterality Date  . GALLBLADDER SURGERY     Patient Active Problem List   Diagnosis Date Noted  . Anemia 08/17/2016  . B-cell lymphoma ([place]) 02/22/2016  . Hyperlipidemia 11/18/2009  . HYPERTENSION, BENIGN 11/18/2009  . CAD, NATIVE VESSEL 11/18/2009  . ATRIAL FIBRILLATION 11/18/2009      Prior to Admission medications   Medication Sig Start Date End Date Taking? Authorizing Provider  aspirin EC 81 MG EC tablet Take 81 mg by mouth daily.     Yes [provider]  Calcium-Vitamin D 600-200 MG-UNIT per tablet Take 1 tablet by mouth daily.    Yes [provider]  carvedilol (COREG) 6.25 MG tablet TAKE ONE AND ONE-HALF TABLETS BY MOUTH TWICE DAILY 11/03/12  Yes Minna Merritts, MD  [place] 125 MCG tablet Take one tablet by mouth  one time daily 04/03/13  Yes Gollan, Kathlene November, MD  hydrOXYzine (ATARAX/VISTARIL) 25 MG tablet Take 1 tablet (25 mg total) by mouth 3 (three) times daily as needed. 02/22/16  Yes Minna Merritts, MD  isosorbide mononitrate (IMDUR) 30 MG 24 hr tablet Take 1 tablet (30 mg total) by mouth 2 (two) times daily. 02/18/17  Yes Gollan, Kathlene November, MD  lisinopril-hydrochlorothiazide (PRINZIDE,ZESTORETIC) 20-12.5 MG tablet Take 1 tablet by mouth 2 (two) times daily.    Yes [provider]  Multiple Vitamin (MULTIVITAMIN) tablet Take 1 tablet by mouth daily.   Yes [provider]  Omega-3 Fatty Acids (FISH OIL) 1200 MG CAPS Take 1,200 mg by mouth daily.   Yes [provider]  simvastatin (ZOCOR) 40 MG tablet Take 20 mg by mouth daily.   Yes [provider]  levofloxacin (LEVAQUIN) 750 MG tablet Take 1 tablet (750 mg total) by mouth daily for 7 days. 01/28/18 02/04/18  Merlyn Lot, MD    Allergies Bee venom; Penicillins; and Shellfish allergy    Social History Social History   Tobacco Use  . Smoking status: Never Smoker  . Smokeless tobacco: Never Used  Substance Use Topics  . Alcohol use: No  . Drug use: No    Review of Systems Patient denies headaches, rhinorrhea, blurry vision, numbness, shortness of breath, chest pain, edema, cough, abdominal pain, nausea, vomiting,  diarrhea, dysuria, fevers, rashes or hallucinations unless otherwise stated above in HPI. ____________________________________________   PHYSICAL EXAM:  VITAL SIGNS: Vitals:   01/28/18 1200 01/28/18 1346  BP: 129/60 (!) 142/64  Pulse: (!) 54 (!) 54  Resp: (!) 22 (!) 22  Temp:  98.2 F (36.8 C)  SpO2: 93% 93%    Constitutional: Alert and oriented. frail appearing but in no acute distress. Eyes: Conjunctivae are normal.  Head: Atraumatic. Nose: No congestion/rhinnorhea. Mouth/Throat: Mucous membranes are moist.   Neck: No stridor. Painless ROM.  Cardiovascular: Normal rate,  regular rhythm. Grossly normal heart sounds.  Good peripheral circulation. Respiratory: Normal respiratory effort.  No retractions. Inspiratory crackles to right anterior lung fields Gastrointestinal: Soft and nontender. No distention. No abdominal bruits. No CVA tenderness. Genitourinary: deferred Musculoskeletal: No lower extremity tenderness nor edema.  No joint effusions. Neurologic:  Normal speech and language. No gross focal neurologic deficits are appreciated. No facial droop Skin:  Skin is warm, dry and intact. No rash noted. Psychiatric: Mood and affect are normal. Speech and behavior are normal.  ____________________________________________   LABS (all labs ordered are listed, but only abnormal results are displayed)  Results for orders placed or performed during the hospital encounter of 01/28/18 (from the past 24 hour(s))  CBC with Differential/Platelet     Status: Abnormal   Collection Time: 01/28/18  9:45 AM  Result Value Ref Range   WBC 8.5 3.6 - 11.0 K/uL   RBC 3.48 (L) 3.80 - 5.20 MIL/uL   Hemoglobin 10.7 (L) 12.0 - 16.0 g/dL   HCT 31.3 (L) 35.0 - 47.0 %   MCV 89.9 80.0 - 100.0 fL   MCH 30.7 26.0 - 34.0 pg   MCHC 34.2 32.0 - 36.0 g/dL   RDW 12.4 11.5 - 14.5 %   Platelets 232 150 - 440 K/uL   Neutrophils Relative % 71 %   Neutro Abs 6.2 1.4 - 6.5 K/uL   Lymphocytes Relative 17 %   Lymphs Abs 1.4 1.0 - 3.6 K/uL   Monocytes Relative 10 %   Monocytes Absolute 0.9 0.2 - 0.9 K/uL   Eosinophils Relative 1 %   Eosinophils Absolute 0.0 0 - 0.7 K/uL   Basophils Relative 1 %   Basophils Absolute 0.0 0 - 0.1 K/uL  Comprehensive metabolic panel     Status: Abnormal   Collection Time: 01/28/18  9:45 AM  Result Value Ref Range   Sodium 134 (L) 135 - 145 mmol/L   Potassium 3.9 3.5 - 5.1 mmol/L   Chloride 101 101 - 111 mmol/L   CO2 23 22 - 32 mmol/L   Glucose, Bld 125 (H) 65 - 99 mg/dL   BUN 47 (H) 6 - 20 mg/dL   Creatinine, Ser 1.08 (H) 0.44 - 1.00 mg/dL   Calcium 7.9  (L) 8.9 - 10.3 mg/dL   Total Protein 6.5 6.5 - 8.1 g/dL   Albumin 2.7 (L) 3.5 - 5.0 g/dL   AST 27 15 - 41 U/L   ALT 16 14 - 54 U/L   Alkaline Phosphatase 137 (H) 38 - 126 U/L   Total Bilirubin 0.9 0.3 - 1.2 mg/dL   GFR calc non Af Amer 46 (L) >60 mL/min   GFR calc Af Amer 54 (L) >60 mL/min   Anion gap 10 5 - 15  Lactic acid, plasma     Status: None   Collection Time: 01/28/18  9:45 AM  Result Value Ref Range   Lactic Acid, Venous 0.9 0.5 - 1.9  mmol/L  Troponin I     Status: None   Collection Time: 01/28/18  9:45 AM  Result Value Ref Range   Troponin I <0.03 <0.03 ng/mL  Urinalysis, Complete w Microscopic     Status: Abnormal   Collection Time: 01/28/18 11:40 AM  Result Value Ref Range   Color, Urine YELLOW (A) YELLOW   APPearance CLEAR (A) CLEAR   Specific Gravity, Urine 1.011 1.005 - 1.030   pH 5.0 5.0 - 8.0   Glucose, UA NEGATIVE NEGATIVE mg/dL   Hgb urine dipstick NEGATIVE NEGATIVE   Bilirubin Urine NEGATIVE NEGATIVE   Ketones, ur NEGATIVE NEGATIVE mg/dL   Protein, ur 30 (A) NEGATIVE mg/dL   Nitrite NEGATIVE NEGATIVE   Leukocytes, UA NEGATIVE NEGATIVE   RBC / HPF 0-5 0 - 5 RBC/hpf   WBC, UA 0-5 0 - 5 WBC/hpf   Bacteria, UA NONE SEEN NONE SEEN   Squamous Epithelial / LPF 0-5 0 - 5   Mucus PRESENT    Hyaline Casts, UA PRESENT    ____________________________________________  EKG My review and personal interpretation at Time: 9:25   Indication: weakness  Rate: 55  Rhythm: sinus Axis: normal Other: lbbb with occasional pac ____________________________________________  RADIOLOGY  I personally reviewed all radiographic images ordered to evaluate for the above acute complaints and reviewed radiology reports and findings.  These findings were personally discussed with the patient.  Please see medical record for radiology report.  ____________________________________________   PROCEDURES  Procedure(s) performed:  Procedures    Critical Care performed:  no ____________________________________________   INITIAL IMPRESSION / ASSESSMENT AND PLAN / ED COURSE  Pertinent labs & imaging results that were available during my care of the patient were reviewed by me and considered in my medical decision making (see chart for details).  DDX: pna, copd, chf, ptx, anemia, sepsis, dehydration  Mayumi Jerilynn Mages Otwell is a 84 y.o. who presents to the ED with symptoms as described above.  Patient does appear dehydrated but nontoxic.  She is afebrile with no hypoxia.  The patient will be placed on continuous pulse oximetry and telemetry for monitoring.  Laboratory evaluation will be sent to evaluate for the above complaints.     Clinical Course as of Jan 28 1454  Tue Jan 28, 2018  1305 No hypoxia on ambulation was much better after IV fluids.  Patient states that she would prefer to go home.  Discussed my concern given her age and chest x-ray showing concerning pneumonia.  However is a slightly atypical presentation as the patient does not have any hypoxia, fever or leukocytosis.  Patient states that she feels well and at this point a trial of outpatient antibiotics does not seem unreasonable particular after we give her a dose of IV antibiotics here in the ER.  Patient is tolerating oral hydration.  May also be can component of worsening underlying malignancy but the patient has stated that her goals of care are comfort as she did not want to be treated for her lymphoma   [PR]    Clinical Course User Index [PR] Merlyn Lot, MD     As part of my medical decision making, I reviewed the following data within the [place] notes reviewed and incorporated, Labs reviewed, notes from prior ED visits.   ____________________________________________   FINAL CLINICAL IMPRESSION(S) / ED DIAGNOSES  Final diagnoses:  Generalized weakness  Pneumonia of right lower lobe due to infectious organism [facility])      NEW MEDICATIONS  STARTED DURING  THIS VISIT:  Discharge Medication List as of 01/28/2018  1:51 PM    START taking these medications   Details  levofloxacin (LEVAQUIN) 750 MG tablet Take 1 tablet (750 mg total) by mouth daily for 7 days., Starting Tue 01/28/2018, Until Tue 02/04/2018, Print         Note:  This document was prepared using Dragon voice recognition software and may include unintentional dictation errors.    Merlyn Lot, MD 01/28/18 1455

## 2018-01-29 LAB — BLOOD CULTURE ID PANEL (REFLEXED)
Acinetobacter baumannii: NOT DETECTED
Candida albicans: NOT DETECTED
Candida glabrata: NOT DETECTED
Candida krusei: NOT DETECTED
Candida parapsilosis: NOT DETECTED
Candida tropicalis: NOT DETECTED
ENTEROBACTERIACEAE SPECIES: NOT DETECTED
ENTEROCOCCUS SPECIES: NOT DETECTED
Enterobacter cloacae complex: NOT DETECTED
Escherichia coli: NOT DETECTED
HAEMOPHILUS INFLUENZAE: NOT DETECTED
Klebsiella oxytoca: NOT DETECTED
Klebsiella pneumoniae: NOT DETECTED
LISTERIA MONOCYTOGENES: NOT DETECTED
METHICILLIN RESISTANCE: NOT DETECTED
NEISSERIA MENINGITIDIS: NOT DETECTED
Proteus species: NOT DETECTED
Pseudomonas aeruginosa: NOT DETECTED
STAPHYLOCOCCUS SPECIES: DETECTED — AB
STREPTOCOCCUS AGALACTIAE: NOT DETECTED
STREPTOCOCCUS SPECIES: NOT DETECTED
Serratia marcescens: NOT DETECTED
Staphylococcus aureus (BCID): NOT DETECTED
Streptococcus pneumoniae: NOT DETECTED
Streptococcus pyogenes: NOT DETECTED

## 2018-01-29 NOTE — ED Provider Notes (Signed)
notified by lab of blood culture result, 1 bottle out of 4 positive for coagulase-negative staph, MSSA. Likely contaminant. Chart reviewed from yesterday, I have asked that we contact the patient to make sure she is feeling better. If so I think that this can be disregarded as a contaminant. However should her symptoms are not improving or she has worsening she will be instructed to return to the emergency room for further evaluation.   Carrie Mew, MD 01/29/18 [phone]

## 2018-01-29 NOTE — ED Notes (Signed)
Attempted to contact the pt via phone but there was no answer, left a message to return a call. Lab called with a report of positive blood cultures. Dr. Joni Fears recommended will call and check on the pt, if there were not feeling any better he recommends that the pt come back for admission.Marland Kitchen

## 2018-01-30 NOTE — ED Notes (Signed)
01/30/2018   7am--patient called and says she is feeling well and will follow up with her pcp.

## 2018-01-31 ENCOUNTER — Other Ambulatory Visit: Payer: Self-pay

## 2018-01-31 ENCOUNTER — Encounter: Payer: Self-pay | Admitting: Emergency Medicine

## 2018-01-31 ENCOUNTER — Emergency Department: Payer: Medicare Other

## 2018-01-31 ENCOUNTER — Emergency Department
Admission: EM | Admit: 2018-01-31 | Discharge: 2018-02-01 | Disposition: A | Discharge status: Home / Self Care | Payer: Medicare Other | Attending: Student in an Organized Health Care Education/Training Program | Admitting: Student in an Organized Health Care Education/Training Program

## 2018-01-31 DIAGNOSIS — Z79899 Other long term (current) drug therapy: Secondary | ICD-10-CM | POA: Insufficient documentation

## 2018-01-31 DIAGNOSIS — T50905A Adverse effect of unspecified drugs, medicaments and biological substances, initial encounter: Secondary | ICD-10-CM

## 2018-01-31 DIAGNOSIS — Z7982 Long term (current) use of aspirin: Secondary | ICD-10-CM | POA: Diagnosis not present

## 2018-01-31 DIAGNOSIS — R059 Cough, unspecified: Secondary | ICD-10-CM

## 2018-01-31 DIAGNOSIS — R05 Cough: Secondary | ICD-10-CM | POA: Diagnosis present

## 2018-01-31 DIAGNOSIS — T3695XA Adverse effect of unspecified systemic antibiotic, initial encounter: Secondary | ICD-10-CM | POA: Insufficient documentation

## 2018-01-31 DIAGNOSIS — I509 Heart failure, unspecified: Secondary | ICD-10-CM | POA: Insufficient documentation

## 2018-01-31 DIAGNOSIS — J189 Pneumonia, unspecified organism: Secondary | ICD-10-CM

## 2018-01-31 DIAGNOSIS — I110 Hypertensive heart disease with heart failure: Secondary | ICD-10-CM | POA: Diagnosis not present

## 2018-01-31 LAB — CBC
HEMATOCRIT: 33.1 % — AB (ref 35.0–47.0)
HEMOGLOBIN: 11.4 g/dL — AB (ref 12.0–16.0)
MCH: 30.5 pg (ref 26.0–34.0)
MCHC: 34.3 g/dL (ref 32.0–36.0)
MCV: 89 fL (ref 80.0–100.0)
Platelets: 360 10*3/uL (ref 150–440)
RBC: 3.72 MIL/uL — AB (ref 3.80–5.20)
RDW: 12.7 % (ref 11.5–14.5)
WBC: 8.7 10*3/uL (ref 3.6–11.0)

## 2018-01-31 LAB — BASIC METABOLIC PANEL
Anion gap: 12 (ref 5–15)
Anion gap: 10 (ref 5–15)
BUN: 53 mg/dL — AB (ref 6–20)
BUN: 53 mg/dL — AB (ref 6–20)
CHLORIDE: 96 mmol/L — AB (ref 101–111)
CHLORIDE: 101 mmol/L (ref 101–111)
CO2: 21 mmol/L — AB (ref 22–32)
CO2: 20 mmol/L — AB (ref 22–32)
CREATININE: 1.36 mg/dL — AB (ref 0.44–1.00)
CREATININE: 1.37 mg/dL — AB (ref 0.44–1.00)
Calcium: 8.2 mg/dL — ABNORMAL LOW (ref 8.9–10.3)
Calcium: 7.5 mg/dL — ABNORMAL LOW (ref 8.9–10.3)
GFR calc Af Amer: 41 mL/min — ABNORMAL LOW (ref >60)
GFR calc non Af Amer: 35 mL/min — ABNORMAL LOW (ref >60)
GFR calc non Af Amer: 35 mL/min — ABNORMAL LOW (ref >60)
GFR, EST AFRICAN AMERICAN: 40 mL/min — AB (ref >60)
Glucose, Bld: 135 mg/dL — ABNORMAL HIGH (ref 65–99)
Glucose, Bld: 93 mg/dL (ref 65–99)
POTASSIUM: 3.8 mmol/L (ref 3.5–5.1)
Potassium: 3.6 mmol/L (ref 3.5–5.1)
SODIUM: 131 mmol/L — AB (ref 135–145)
Sodium: 129 mmol/L — ABNORMAL LOW (ref 135–145)

## 2018-01-31 LAB — CULTURE, BLOOD (ROUTINE X 2)

## 2018-01-31 MED ORDER — DOXYCYCLINE HYCLATE 100 MG PO TABS
100.0000 mg | ORAL_TABLET | Freq: Once | ORAL | Status: AC
  Administered 2018-01-31: 100 mg via ORAL
  Filled 2018-01-31: qty 1

## 2018-01-31 MED ORDER — SODIUM CHLORIDE 0.9 % IV BOLUS
1000.0000 mL | Freq: Once | INTRAVENOUS | Status: AC
  Administered 2018-01-31: 1000 mL via INTRAVENOUS

## 2018-01-31 NOTE — ED Notes (Signed)
Friends at bedside with pt. They report they will remain with pt until decision is made about care. Pt in NAD at this time.

## 2018-01-31 NOTE — ED Triage Notes (Signed)
Pt arrived via POV with reports of being seen here on Tuesday for a cough and dx with PNA, pt states she was offered admission but chose to go home. Pt states the cough is not really improving and is worse at night.  Pt also states she was hallucinating while taking the antibiotic-Cipro.  Pt states cough is productive.  Denies shortness of breath.

## 2018-01-31 NOTE — ED Notes (Signed)
Pt reports she started having hallucinations yesterday after having started her antibiotic. Pt reports they have been of bugs and animals. Pt denies fear of the hallucinations themselves but states that she is fearful that she is having them. Pt in NAD at this time. No hallucinations at this time.   Pt also reporting decreased appetite over the past two days. No NVD reported.

## 2018-01-31 NOTE — ED Provider Notes (Signed)
[facility] Emergency Department Provider Note    First MD Initiated Contact with Patient 01/31/18 2104     (approximate)  I have reviewed the triage vital signs and the nursing notes.   HISTORY  Chief Complaint Pneumonia    HPI Maria Mosley is a 84 y.o. female that I recently saw in the ER complaining of cough and shortness of breath sent home on Levaquin presents to the ER with persistent cough but is also concerned because she is feeling that she is hallucinating at night on the antibiotics.  States that she is seeing bugs crawling around is having difficulty sleeping due to the cough.  Denies any fevers and otherwise feels that she is improved.  States that she has had a poor appetite.  Denies any chest pain.  No nausea or vomiting.    Past Medical History:  Diagnosis Date  . Atrial fibrillation ([place])   . CHF (congestive heart failure) ([place])   . Hyperlipidemia   . Hypertension   . Small cell B-cell lymphoma (HCC)    Family History  Problem Relation Age of Onset  . Heart attack Father   . Heart attack Son 34  . Coronary artery disease Other   . Diabetes Other   . Hyperlipidemia Other   . Hypertension Other    Past Surgical History:  Procedure Laterality Date  . GALLBLADDER SURGERY     Patient Active Problem List   Diagnosis Date Noted  . Anemia 08/17/2016  . B-cell lymphoma ([place]) 02/22/2016  . Hyperlipidemia 11/18/2009  . HYPERTENSION, BENIGN 11/18/2009  . CAD, NATIVE VESSEL 11/18/2009  . ATRIAL FIBRILLATION 11/18/2009      Prior to Admission medications   Medication Sig Start Date End Date Taking? Authorizing Provider  aspirin EC 81 MG EC tablet Take 81 mg by mouth daily.      [provider]  Calcium-Vitamin D 600-200 MG-UNIT per tablet Take 1 tablet by mouth daily.     [provider]  carvedilol (COREG) 6.25 MG tablet TAKE ONE AND ONE-HALF TABLETS BY MOUTH TWICE DAILY 11/03/12   Minna Merritts, MD  [place] 125  MCG tablet Take one tablet by mouth one time daily 04/03/13   Minna Merritts, MD  hydrOXYzine (ATARAX/VISTARIL) 25 MG tablet Take 1 tablet (25 mg total) by mouth 3 (three) times daily as needed. 02/22/16   Minna Merritts, MD  isosorbide mononitrate (IMDUR) 30 MG 24 hr tablet Take 1 tablet (30 mg total) by mouth 2 (two) times daily. 02/18/17   Minna Merritts, MD  levofloxacin (LEVAQUIN) 750 MG tablet Take 1 tablet (750 mg total) by mouth daily for 7 days. 01/28/18 02/04/18  Merlyn Lot, MD  lisinopril-hydrochlorothiazide (PRINZIDE,ZESTORETIC) 20-12.5 MG tablet Take 1 tablet by mouth 2 (two) times daily.     [provider]  Multiple Vitamin (MULTIVITAMIN) tablet Take 1 tablet by mouth daily.    [provider]  Omega-3 Fatty Acids (FISH OIL) 1200 MG CAPS Take 1,200 mg by mouth daily.    [provider]  simvastatin (ZOCOR) 40 MG tablet Take 20 mg by mouth daily.    [provider]    Allergies Bee venom; Penicillins; and Shellfish allergy    Social History Social History   Tobacco Use  . Smoking status: Never Smoker  . Smokeless tobacco: Never Used  Substance Use Topics  . Alcohol use: No  . Drug use: No    Review of Systems Patient denies  headaches, rhinorrhea, blurry vision, numbness, shortness of breath, chest pain, edema, cough, abdominal pain, nausea, vomiting, diarrhea, dysuria, fevers, rashes or hallucinations unless otherwise stated above in HPI. ____________________________________________   PHYSICAL EXAM:  VITAL SIGNS: Vitals:   01/31/18 1848  BP: 104/75  Pulse: 62  Resp: 20  Temp: 97.6 F (36.4 C)  SpO2: 98%    Constitutional: Alert and oriented.  Eyes: Conjunctivae are normal.  Head: Atraumatic. Nose: No congestion/rhinnorhea. Mouth/Throat: Mucous membranes are moist.   Neck: No stridor. Painless ROM.  Cardiovascular: Normal rate, regular rhythm. Grossly normal heart sounds.  Good peripheral  circulation. Respiratory: Normal respiratory effort.  No retractions. Lungs CTAB. Gastrointestinal: Soft and nontender. No distention. No abdominal bruits. No CVA tenderness. Genitourinary:  Musculoskeletal: No lower extremity tenderness nor edema.  No joint effusions. Neurologic:  Normal speech and language. No gross focal neurologic deficits are appreciated. No facial droop Skin:  Skin is warm, dry and intact. No rash noted. Psychiatric: Mood and affect are normal. Speech and behavior are normal.  ____________________________________________   LABS (all labs ordered are listed, but only abnormal results are displayed)  Results for orders placed or performed during the hospital encounter of 01/31/18 (from the past 24 hour(s))  CBC     Status: Abnormal   Collection Time: 01/31/18  6:49 PM  Result Value Ref Range   WBC 8.7 3.6 - 11.0 K/uL   RBC 3.72 (L) 3.80 - 5.20 MIL/uL   Hemoglobin 11.4 (L) 12.0 - 16.0 g/dL   HCT 33.1 (L) 35.0 - 47.0 %   MCV 89.0 80.0 - 100.0 fL   MCH 30.5 26.0 - 34.0 pg   MCHC 34.3 32.0 - 36.0 g/dL   RDW 12.7 11.5 - 14.5 %   Platelets 360 150 - 440 K/uL  Basic metabolic panel     Status: Abnormal   Collection Time: 01/31/18  6:49 PM  Result Value Ref Range   Sodium 129 (L) 135 - 145 mmol/L   Potassium 3.8 3.5 - 5.1 mmol/L   Chloride 96 (L) 101 - 111 mmol/L   CO2 21 (L) 22 - 32 mmol/L   Glucose, Bld 135 (H) 65 - 99 mg/dL   BUN 53 (H) 6 - 20 mg/dL   Creatinine, Ser 1.36 (H) 0.44 - 1.00 mg/dL   Calcium 8.2 (L) 8.9 - 10.3 mg/dL   GFR calc non Af Amer 35 (L) >60 mL/min   GFR calc Af Amer 41 (L) >60 mL/min   Anion gap 12 5 - 15   ____________________________________________  EKG My review and personal interpretation at Time: 0:12   Indication: cough  Rate: 70  Rhythm: sinus with frequent PVC Axis: normal Other: no significant changes as compared to previous. ____________________________________________  RADIOLOGY  I personally reviewed all  radiographic images ordered to evaluate for the above acute complaints and reviewed radiology reports and findings.  These findings were personally discussed with the patient.  Please see medical record for radiology report.  ____________________________________________   PROCEDURES  Procedure(s) performed:  Procedures    Critical Care performed: no ____________________________________________   INITIAL IMPRESSION / ASSESSMENT AND PLAN / ED COURSE  Pertinent labs & imaging results that were available during my care of the patient were reviewed by me and considered in my medical decision making (see chart for details).   DDX: pna, ptx, chf, electrolyte abn, digoxin tox, medication tox  Nakhia Levitan Scheiber is a 84 y.o. who presents to the ED with symptoms as described above.  Patient  well-appearing.  Afebrile without hypoxia.  Blood pressure stable.  Blood work does show evidence of downtrending sodium patient does endorse decreased p.o. intake therefore IV fluids given.  Patient reporting hallucinations particularly to some visual disturbances that she notices at night after starting the antibiotics.  We will switch Levaquin to doxycycline as has less reported side effects of hallucinations.  Patient's mentation is appropriate.  Does not appear encephalopathic at this time.  The patient is on digoxin we will send for digoxin level to ensure that she is not having any signs symptoms of digoxin toxicity particular in setting of her renal dysfunction.  Repeat BMP improved after IV fluids.  Patient be signed out to oncoming physician Dr. Owens Shark pending reassessment.  Anticipate patient will be stable and appropriate for discharge assuming digoxin level normal.      As part of my medical decision making, I reviewed the following data within the [place] notes reviewed and incorporated, Labs reviewed, notes from prior ED visits and [place] Controlled Substance  Database   ____________________________________________   FINAL CLINICAL IMPRESSION(S) / ED DIAGNOSES  Final diagnoses:  Cough  Adverse effect of drug, initial encounter      NEW MEDICATIONS STARTED DURING THIS VISIT:  New Prescriptions   No medications on file     Note:  This document was prepared using Dragon voice recognition software and may include unintentional dictation errors.    Merlyn Lot, MD 02/01/18 [phone]

## 2018-01-31 NOTE — ED Notes (Signed)
D/w Dr. Quentin Cornwall, do basic labs and order CXR

## 2018-02-01 LAB — DIGOXIN LEVEL: DIGOXIN LVL: 1.7 ng/mL (ref 0.8–2.0)

## 2018-02-01 MED ORDER — DOXYCYCLINE HYCLATE 100 MG PO TABS: 100.0000 mg | ORAL_TABLET | Freq: Two times a day (BID) | ORAL | 0 refills | Status: AC

## 2018-02-01 NOTE — Discharge Instructions (Addendum)
Please discontinue your Levaquin and you can start taking the new antibiotic doxycycline.  Be sure to drink plenty of fluids.  Return to the ER if you have any additional questions or concerns.

## 2018-02-01 NOTE — ED Provider Notes (Signed)
I assumed care of the patient from Dr. Quentin Cornwall at 12:00 AM with recommendation to follow-up on digoxin level and if normal patient stable for discharge home patient's digoxin level was indeed normal at 1.7   Gregor Hams, MD 02/01/18 [phone]

## 2018-02-02 LAB — CULTURE, BLOOD (ROUTINE X 2)
CULTURE: NO GROWTH
Special Requests: ADEQUATE

## 2018-03-21 ENCOUNTER — Other Ambulatory Visit: Payer: Self-pay | Admitting: Cardiovascular Disease

## 2018-09-20 IMAGING — CR DG CHEST 1V
1 series · 1 of 1 positions shown · non-contrast
Comparison: 01/28/2018 PA view

CLINICAL DATA: Pneumonia

EXAM:
CHEST  1 VIEW

[chest lat]
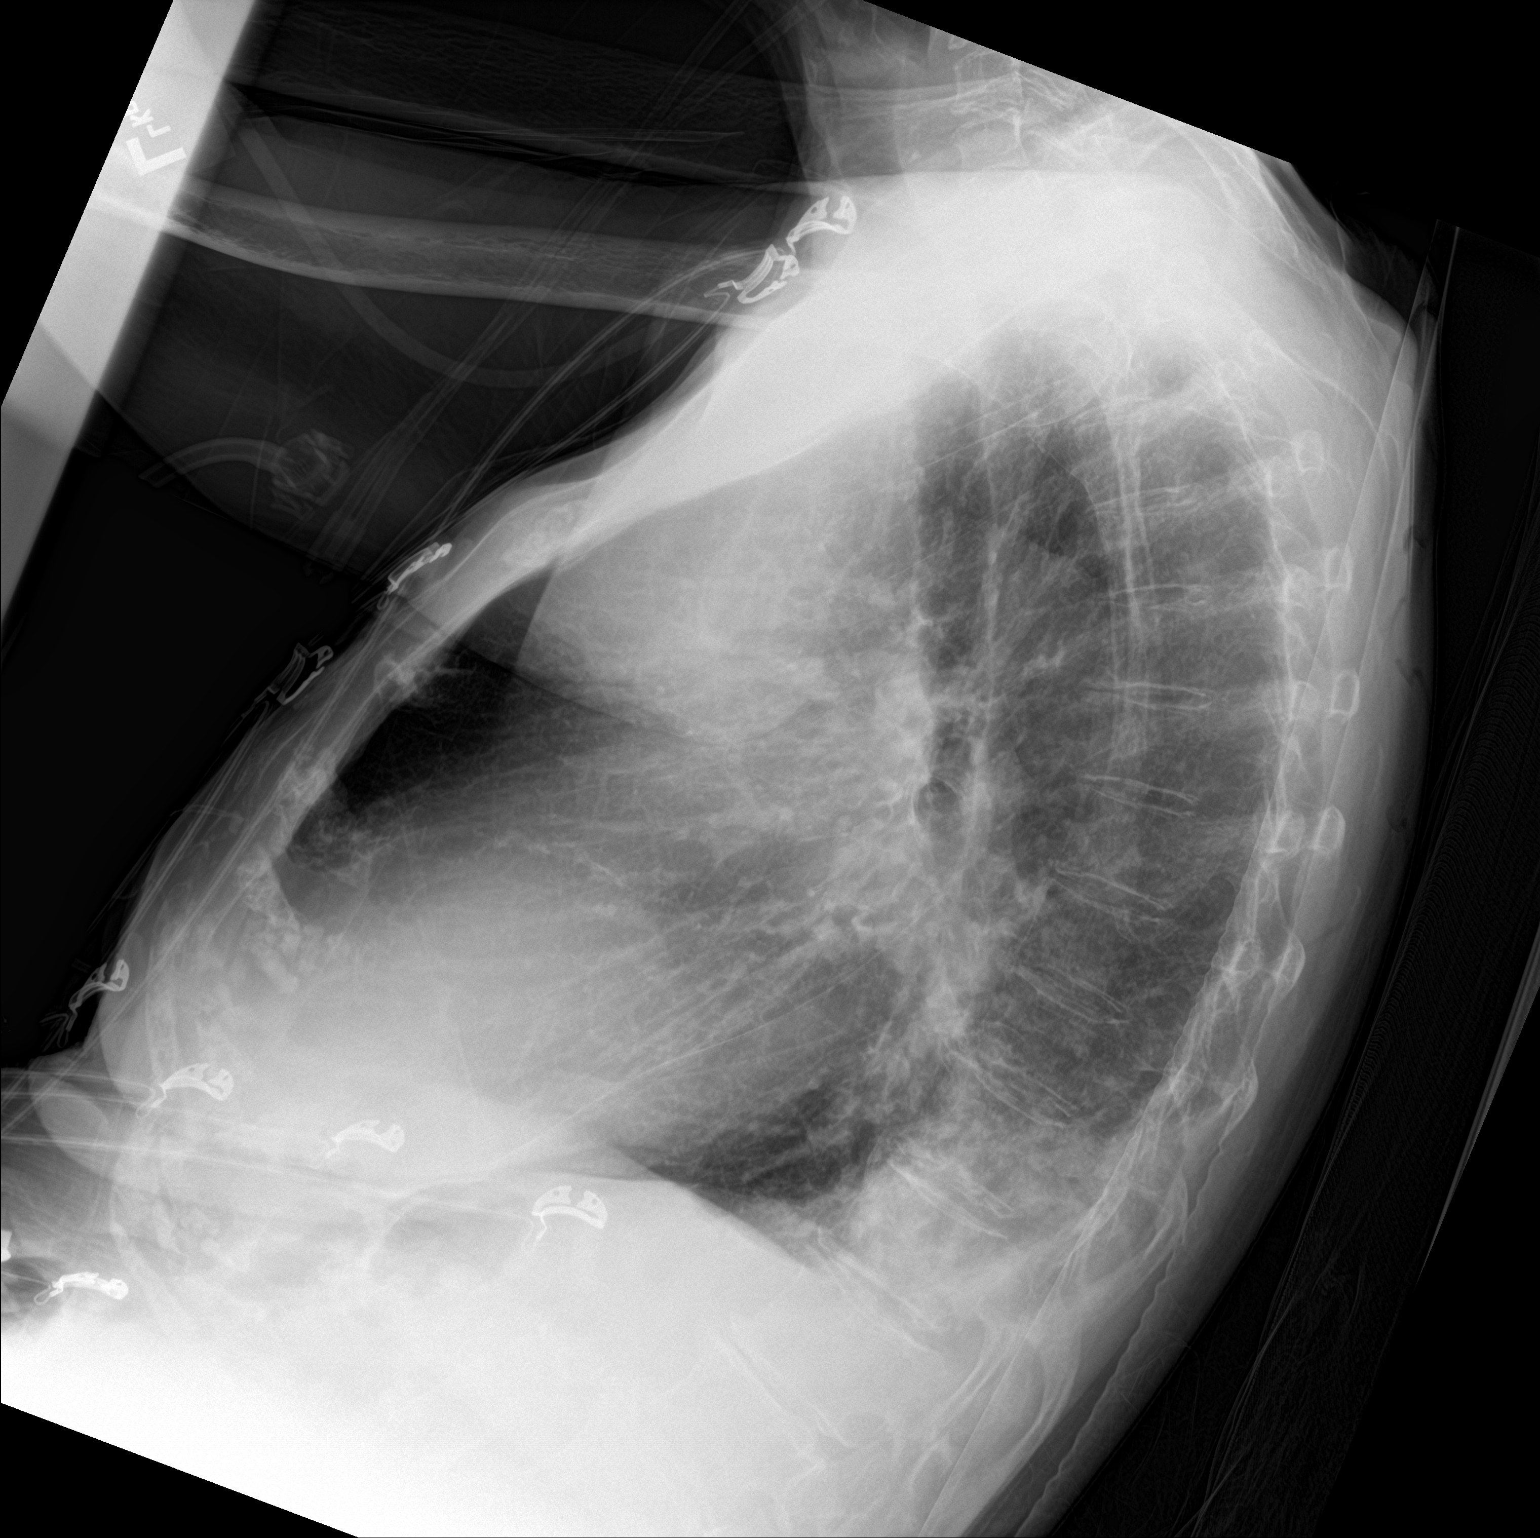

[1 of 1 positions shown; findings below may reference images not displayed]

FINDINGS: Consolidation noted posteriorly on this lateral view corresponding
to the consolidation seen on the frontal view in the right lower
lobe. Suspect small right effusion.
IMPRESSION: Consolidation posteriorly corresponds to the right lower lobe
infiltrate seen on prior PA view. Findings compatible with
pneumonia. Followup PA and lateral chest X-ray is recommended in 3-4
weeks following trial of antibiotic therapy to ensure resolution and
exclude underlying malignancy.

Small right effusion.

## 2018-09-23 IMAGING — CR DG CHEST 2V
2 series · 2 of 2 positions shown · non-contrast
Comparison: 01/28/2018

CLINICAL DATA: Productive cough.

EXAM:
CHEST - 2 VIEW

[chest pa]
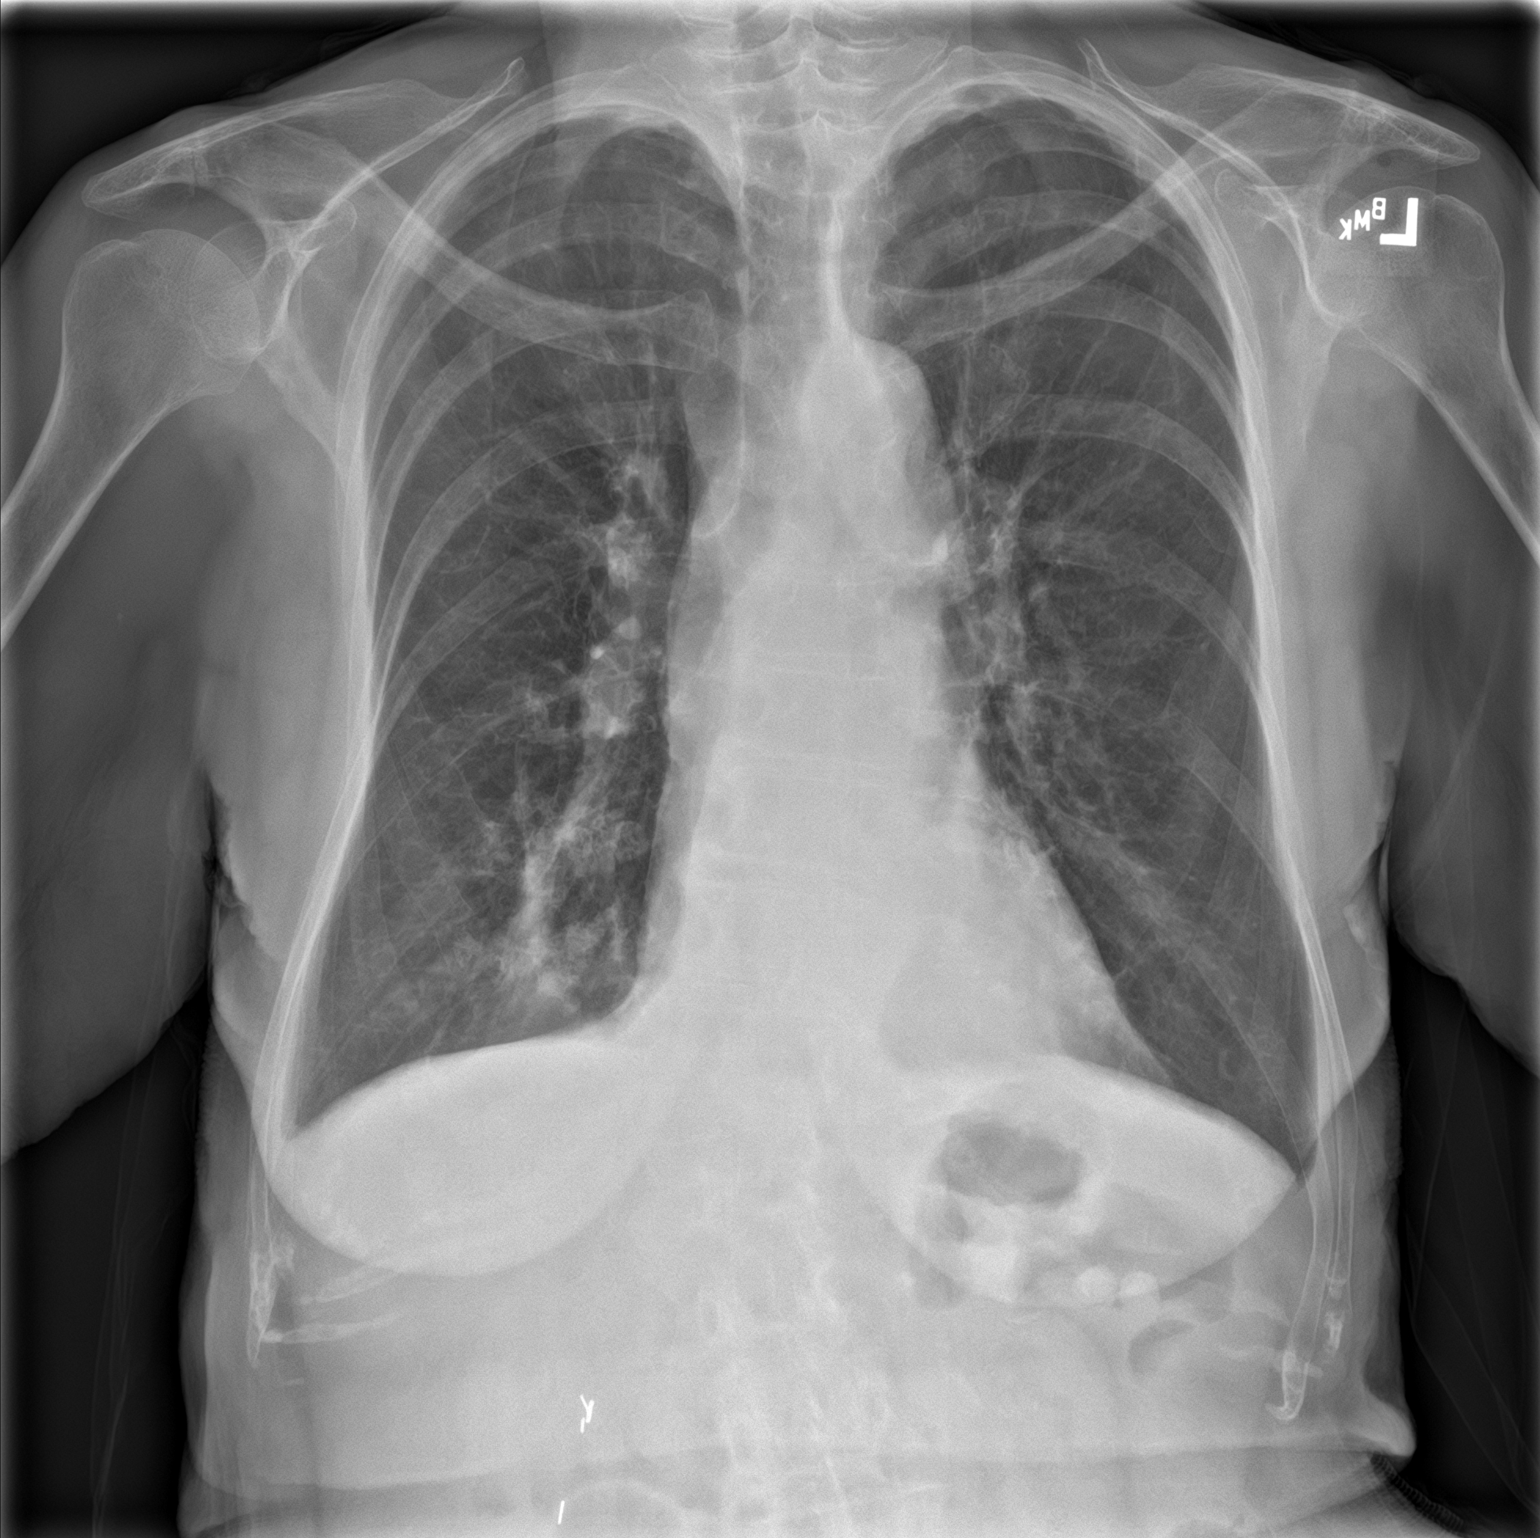

[chest lat]
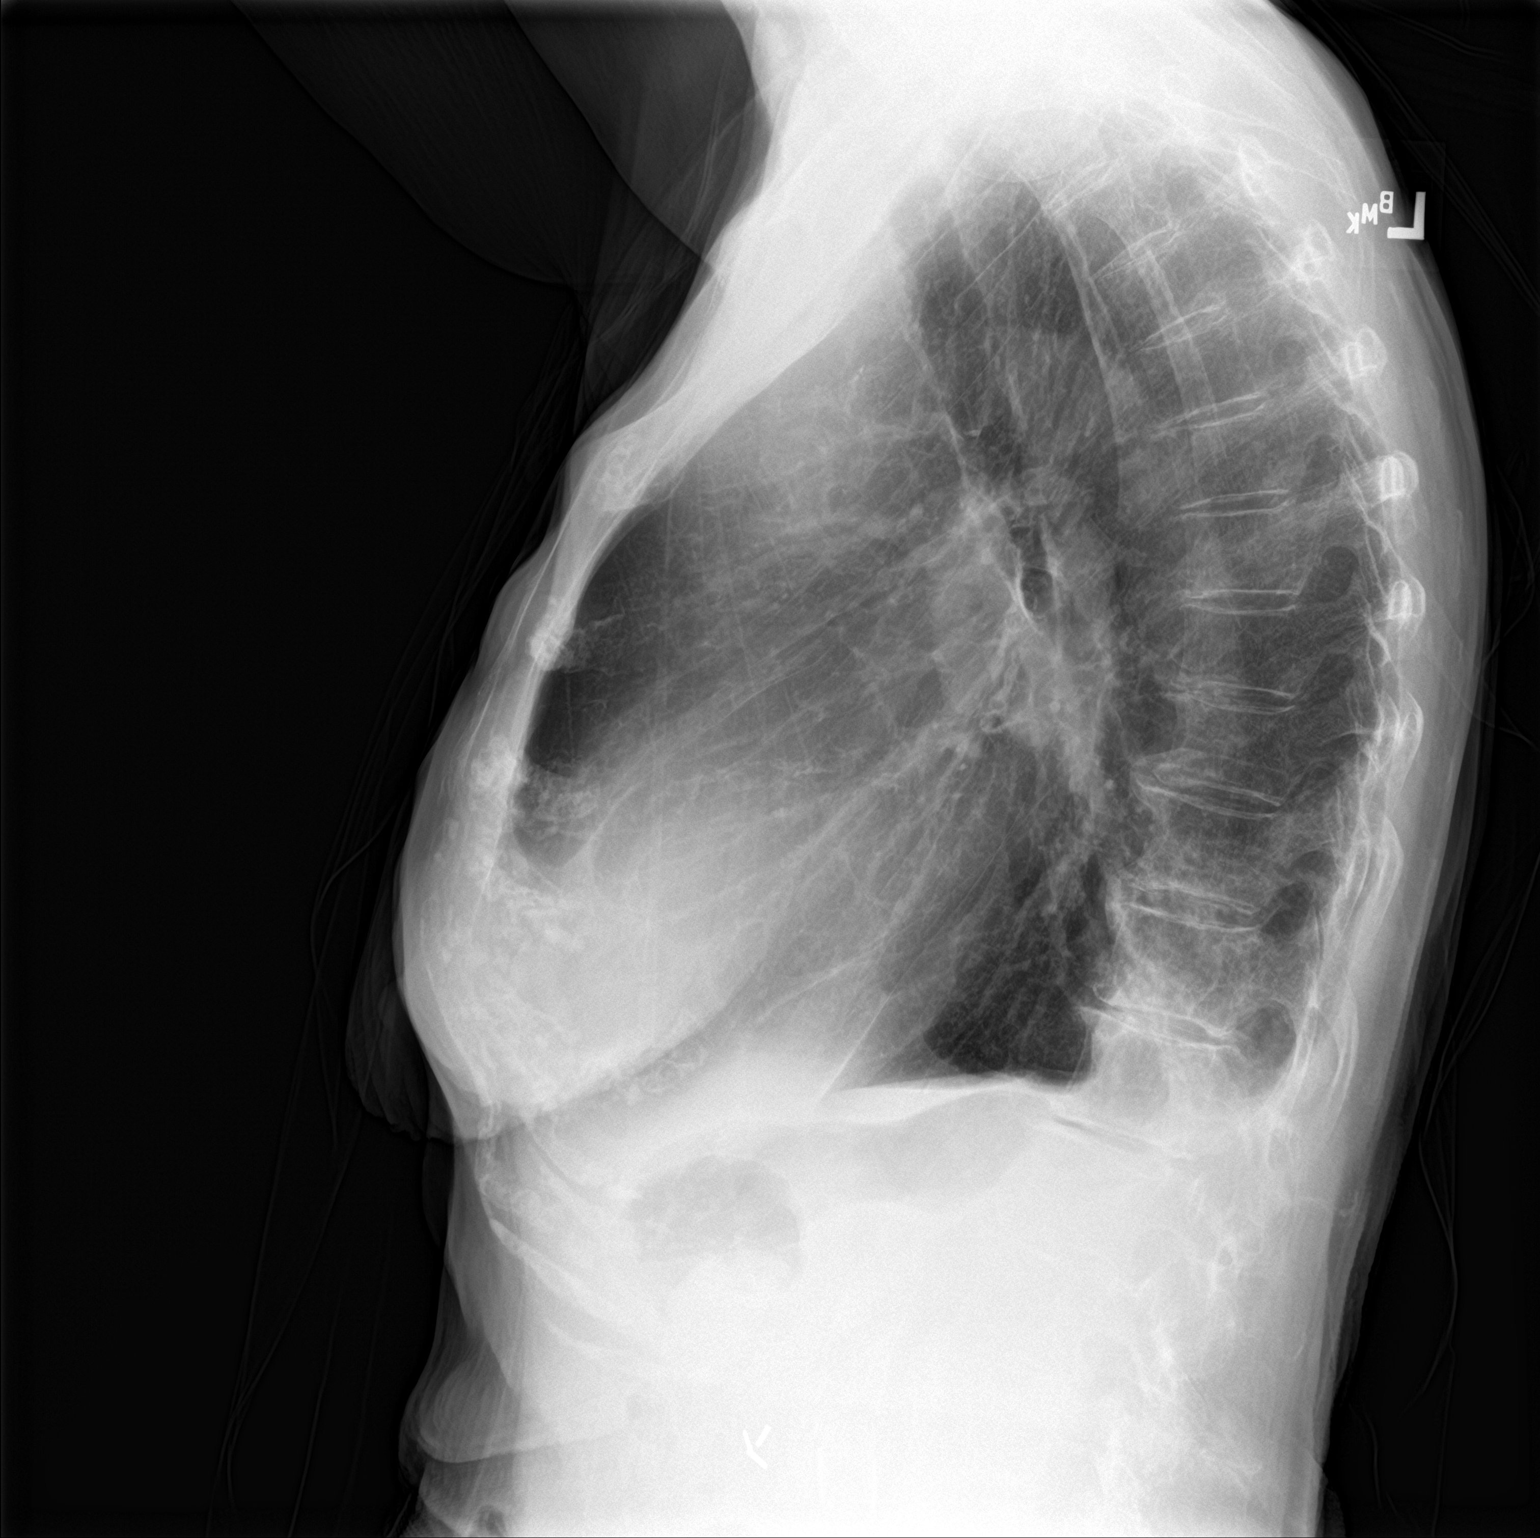

[2 of 2 positions shown; findings below may reference images not displayed]

FINDINGS: The cardiomediastinal silhouette scratched of the cardiac silhouette
is normal in size. Bilateral hilar fullness is less prominent on
this PA radiograph. Right lower lobe airspace opacity has improved.
There is underlying hyperinflation. No new airspace consolidation,
edema, or pneumothorax is identified. There may be tiny bilateral
pleural effusions. Thoracolumbar dextroscoliosis and right upper
quadrant abdominal surgical clips are noted.
IMPRESSION: Improved right lower lobe airspace opacity consistent with
pneumonia. Continued follow-up is recommended with PA and lateral
chest radiographs in 3-4 weeks to ensure complete resolution and
exclude an underlying mass.

## 2019-02-04 ENCOUNTER — Telehealth: Payer: Self-pay

## 2019-02-04 NOTE — Telephone Encounter (Signed)

## 2019-02-15 NOTE — Progress Notes (Signed)
Virtual Visit via Telephone Note   This visit type was conducted due to national recommendations for restrictions regarding the COVID-19 Pandemic (e.g. social distancing) in an effort to limit this patient's exposure and mitigate transmission in our community.  Due to her co-morbid illnesses, this patient is at least at moderate risk for complications without adequate follow up.  This format is felt to be most appropriate for this patient at this time.  The patient did not have access to video technology/had technical difficulties with video requiring transitioning to audio format only (telephone).  All issues noted in this document were discussed and addressed.  No physical exam could be performed with this format.  Please refer to the patient's chart for her  consent to telehealth for [facility].   I connected with  Maria Mosley on 02/16/19 by a video enabled telemedicine application and verified that I am speaking with the correct person using two identifiers. I discussed the limitations of evaluation and management by telemedicine. The patient expressed understanding and agreed to proceed.   Evaluation Performed:  Follow-up visit  Date:  02/16/2019   ID:  Maria Mosley, DOB 1933-01-23, MRN [id number]  Patient Location:  [address] [place] [address]   Provider location:   [facility], [place] office  PCP:  Rusty Aus, MD  Cardiologist:  Patsy Baltimore   Chief Complaint:  "down days"   History of Present Illness:    Maria Mosley is a 85 y.o. female who presents via audio/video conferencing for a telehealth visit today.   The patient does not symptoms concerning for COVID-19 infection (fever, chills, cough, or new SHORTNESS OF BREATH).   Patient has a past medical history of coronary artery disease,  subtotally occluded LAD that fills via collaterals from her right coronary artery as well as 40% proximal RCA lesion with ejection  fraction 50-55% in July 2008  atrial fibrillation noted prior to 2011, started on digoxin for rhythm control.  diagnosis of lymphoma,  does not want therapy for her lymphoma Previous problems with depression, loss of her son in 2015, who presents for routine followup of her coronary artery disease.   Does her own chores, lives alone Some days feel depressed  Only taking one lisinopril HCTZ CR 1.5, BUN 43 on 12/2014 Potassium 5.0, sodium 141 BP 135/70  When last seen by Dr. Sabra Heck 12/2018, BOP low, told to cut back on lisinopril  Off zocor, used to take 20 mg daily Cholesterol above goal  Other past medical history reviewed  Previous problems with depression, loss of her son in 72, other family member earlier in 2016  She  is alone, lost both of her children, even her stepdaughter Her daughter passed away from a reaction from chemotherapy.   previous admission to the hospital for near syncope, hypotension, dehydration and found to have a creatinine of 1.54.  Prior CV studies:   The following studies were reviewed today:    Past Medical History:  Diagnosis Date  . Atrial fibrillation ([employer])   . CHF (congestive heart failure) ([place])   . Hyperlipidemia   . Hypertension   . Small cell B-cell lymphoma [facility])    Past Surgical History:  Procedure Laterality Date  . GALLBLADDER SURGERY       No outpatient medications have been marked as taking for the 02/16/19 encounter (Telemedicine) with Minna Merritts, MD.     Allergies:   Raelyn Ensign  venom, Penicillins, and Shellfish allergy   Social History   Tobacco Use  . Smoking status: Never Smoker  . Smokeless tobacco: Never Used  Substance Use Topics  . Alcohol use: No  . Drug use: No     Current Outpatient Medications on File Prior to Visit  Medication Sig Dispense Refill  . aspirin EC 81 MG EC tablet Take 81 mg by mouth daily.      . Calcium-Vitamin D 600-200 MG-UNIT per tablet Take 1 tablet by mouth daily.     .  carvedilol (COREG) 6.25 MG tablet TAKE ONE AND ONE-HALF TABLETS BY MOUTH TWICE DAILY 90 tablet 4  . Peculiar 125 MCG tablet Take one tablet by mouth one time daily 30 tablet 6  . hydrOXYzine (ATARAX/VISTARIL) 25 MG tablet Take 1 tablet (25 mg total) by mouth 3 (three) times daily as needed. 30 tablet 0  . isosorbide mononitrate (IMDUR) 30 MG 24 hr tablet TAKE 1 TABLET (30 MG TOTAL) BY MOUTH 2 (TWO) TIMES DAILY. 180 tablet 3  . lisinopril-hydrochlorothiazide (PRINZIDE,ZESTORETIC) 20-12.5 MG tablet Take 1 tablet by mouth 2 (two) times daily.     . Multiple Vitamin (MULTIVITAMIN) tablet Take 1 tablet by mouth daily.    . Omega-3 Fatty Acids (FISH OIL) 1200 MG CAPS Take 1,200 mg by mouth daily.    . simvastatin (ZOCOR) 40 MG tablet Take 20 mg by mouth daily.     No current facility-administered medications on file prior to visit.      Family Hx: The patient's family history includes Coronary artery disease in an other family member; Diabetes in an other family member; Heart attack in her father; Heart attack (age of onset: 1) in her son; Hyperlipidemia in an other family member; Hypertension in an other family member.  ROS:   Please see the history of present illness.    Review of Systems  Constitutional: Negative.   Respiratory: Negative.   Cardiovascular: Negative.   Gastrointestinal: Negative.   Musculoskeletal: Negative.   Neurological: Negative.   Psychiatric/Behavioral: Negative.   All other systems reviewed and are negative.    Labs/Other Tests and Data Reviewed:    Recent Labs: No results found for requested labs within last 8760 hours.   Recent Lipid Panel No results found for: CHOL, TRIG, HDL, CHOLHDL, LDLCALC, LDLDIRECT  Wt Readings from Last 3 Encounters:  01/31/18 110 lb (49.9 kg)  01/28/18 110 lb (49.9 kg)  02/18/17 130 lb 8 oz (59.2 kg)     Exam:    Vital Signs: Vital signs may also be detailed in the HPI There were no vitals taken for this visit.  Wt Readings  from Last 3 Encounters:  01/31/18 110 lb (49.9 kg)  01/28/18 110 lb (49.9 kg)  02/18/17 130 lb 8 oz (59.2 kg)   Temp Readings from Last 3 Encounters:  01/31/18 97.6 F (36.4 C) (Oral)  01/28/18 98.2 F (36.8 C) (Oral)   BP Readings from Last 3 Encounters:  02/01/18 112/62  01/28/18 (!) 142/64  02/18/17 (!) 180/72   Pulse Readings from Last 3 Encounters:  02/01/18 71  01/28/18 (!) 54  02/18/17 65     Well nourished, well developed female in no acute distress. Constitutional:  oriented to person, place, and time. No distress.   ASSESSMENT & PLAN:    Atherosclerosis of native coronary artery of native heart with stable angina pectoris ([place]) -  Currently with no symptoms of angina. No further workup at this time. Continue current medication regimen.  Atrial fibrillation, unspecified type ([place]) -  Remote hx, not on anticoagulation Needs digoxin  HYPERTENSION, BENIGN -  Low from dehydration 12/2018, PMD cut lisinopril back to one a day Could cut out HCTZ next depending on lab results through PMD  Mixed hyperlipidemia -  Will restart simvastatin 20 mg daily Goal LDL <70  Lymphoma Did not want treatment    COVID-19 Education: The signs and symptoms of COVID-19 were discussed with the patient and how to seek care for testing (follow up with PCP or arrange E-visit).  The importance of social distancing was discussed today.  Patient Risk:   After full review of this patients clinical status, I feel that they are at least moderate risk at this time.  Time:   Today, I have spent 25 minutes with the patient with telehealth technology discussing the cardiac and medical problems/diagnoses detailed above   10 min spent reviewing the chart prior to patient visit today   Medication Adjustments/Labs and Tests Ordered: Current medicines are reviewed at length with the patient today.  Concerns regarding medicines are outlined above.   Tests Ordered: No tests ordered    Medication Changes: No changes made   Disposition: Follow-up in 6 months   Signed, Ida Rogue, MD  02/16/2019 9:33 AM    [place] Office [address] [place] [id number], [place], [place] [id number]

## 2019-02-16 ENCOUNTER — Other Ambulatory Visit: Payer: Self-pay

## 2019-02-16 ENCOUNTER — Telehealth (INDEPENDENT_AMBULATORY_CARE_PROVIDER_SITE_OTHER): Payer: Medicare Other | Admitting: Cardiovascular Disease

## 2019-02-16 DIAGNOSIS — E782 Mixed hyperlipidemia: Secondary | ICD-10-CM

## 2019-02-16 DIAGNOSIS — I25118 Atherosclerotic heart disease of native coronary artery with other forms of angina pectoris: Secondary | ICD-10-CM | POA: Diagnosis not present

## 2019-02-16 DIAGNOSIS — I119 Hypertensive heart disease without heart failure: Secondary | ICD-10-CM

## 2019-02-16 DIAGNOSIS — I4891 Unspecified atrial fibrillation: Secondary | ICD-10-CM | POA: Diagnosis not present

## 2019-02-16 DIAGNOSIS — C8300 Small cell B-cell lymphoma, unspecified site: Secondary | ICD-10-CM

## 2019-02-16 DIAGNOSIS — I10 Essential (primary) hypertension: Secondary | ICD-10-CM

## 2019-02-16 MED ORDER — SIMVASTATIN 40 MG PO TABS: 20.0000 mg | ORAL_TABLET | Freq: Every day | ORAL | 3 refills | Status: DC

## 2019-02-16 NOTE — Patient Instructions (Addendum)
Medication Instructions:  Your physician has recommended you make the following change in your medication:  1. RESTART Simvastatin 20 mg once daily  If you need a refill on your cardiac medications before your next appointment, please call your pharmacy.    Lab work: No new labs needed   If you have labs (blood work) drawn today and your tests are completely normal, you will receive your results only by: Marland Kitchen MyChart Message (if you have MyChart) OR . A paper copy in the mail If you have any lab test that is abnormal or we need to change your treatment, we will call you to review the results.   Testing/Procedures: No new testing needed   Follow-Up: At [facility], you and your health needs are our priority.  As part of our continuing mission to provide you with exceptional heart care, we have created designated Provider Care Teams.  These Care Teams include your primary Cardiologist (physician) and Advanced Practice Providers (APPs -  Physician Assistants and Nurse Practitioners) who all work together to provide you with the care you need, when you need it.  . You will need a follow up appointment in 6 months .   Please call our office 2 months in advance to schedule this appointment.    . Providers on your designated Care Team:   . Murray Hodgkins, NP . Christell Faith, PA-C . Marrianne Mood, PA-C  Any Other Special Instructions Will Be Listed Below (If Applicable).  For educational health videos Log in to : [email or web address] Or : [email or web address], password : [email or web address]

## 2019-03-29 ENCOUNTER — Other Ambulatory Visit: Payer: Self-pay | Admitting: Cardiovascular Disease

## 2020-02-23 ENCOUNTER — Other Ambulatory Visit: Payer: Self-pay | Admitting: Internal Medicine

## 2020-02-23 DIAGNOSIS — N185 Chronic kidney disease, stage 5: Secondary | ICD-10-CM

## 2020-02-23 DIAGNOSIS — N170 Acute kidney failure with tubular necrosis: Secondary | ICD-10-CM

## 2020-03-04 ENCOUNTER — Ambulatory Visit
Admission: RE | Admit: 2020-03-04 | Discharge: 2020-03-04 | Disposition: A | Discharge status: Home / Self Care | Payer: Medicare Other | Source: Ambulatory Visit | Attending: Internal Medicine | Admitting: Internal Medicine

## 2020-03-04 ENCOUNTER — Other Ambulatory Visit: Payer: Self-pay

## 2020-03-04 DIAGNOSIS — N170 Acute kidney failure with tubular necrosis: Secondary | ICD-10-CM | POA: Insufficient documentation

## 2020-03-04 DIAGNOSIS — N185 Chronic kidney disease, stage 5: Secondary | ICD-10-CM | POA: Diagnosis present

## 2020-03-19 ENCOUNTER — Other Ambulatory Visit: Payer: Self-pay | Admitting: Cardiovascular Disease

## 2020-03-21 NOTE — Telephone Encounter (Signed)
Please contact pt for future appointment. Pt overdue for 6 month f/u.  

## 2020-03-21 NOTE — Telephone Encounter (Signed)
LVM for patient to call back and reschedule.

## 2020-03-23 ENCOUNTER — Other Ambulatory Visit: Payer: Self-pay

## 2020-03-23 NOTE — Telephone Encounter (Signed)
Scheduled

## 2020-04-09 ENCOUNTER — Other Ambulatory Visit: Payer: Self-pay | Admitting: Cardiovascular Disease

## 2020-04-16 ENCOUNTER — Other Ambulatory Visit: Payer: Self-pay | Admitting: Cardiovascular Disease

## 2020-05-04 DEATH — deceased

## 2020-05-09 NOTE — Progress Notes (Deleted)
No Show

## 2020-05-10 ENCOUNTER — Ambulatory Visit: Payer: Medicare Other | Admitting: Cardiovascular Disease

## 2020-05-11 ENCOUNTER — Encounter: Payer: Self-pay | Admitting: Cardiovascular Disease

## 2020-10-25 IMAGING — US US RENAL
1 series · 15 of 25 positions shown · non-contrast
Comparison: Chest CT 10/19/2015.

CLINICAL DATA: 86-year-old female with acute renal failure, tubular
necrosis. Underlying chronic kidney disease.

EXAM:
RENAL / URINARY TRACT ULTRASOUND COMPLETE

[Series 1: us renal · 15 of 28 slices shown]
[im 1/28]
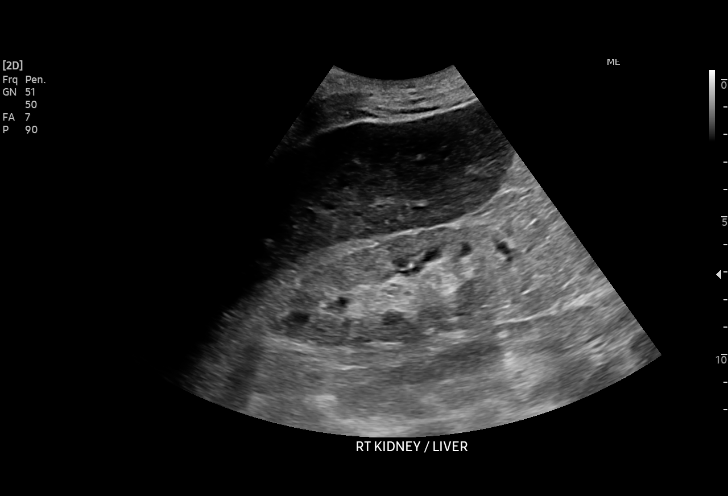
[im 3/28]
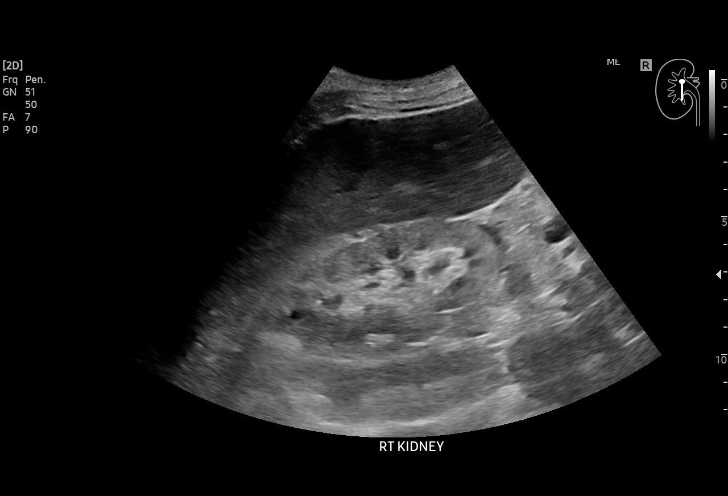
[im 5/28]
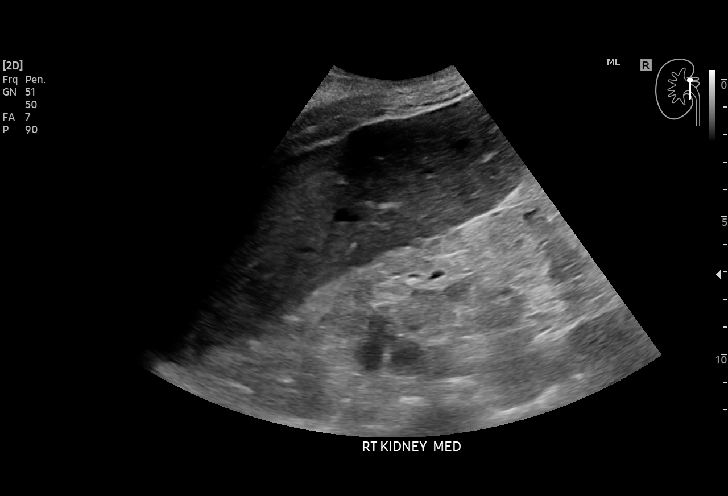
[im 6/28]
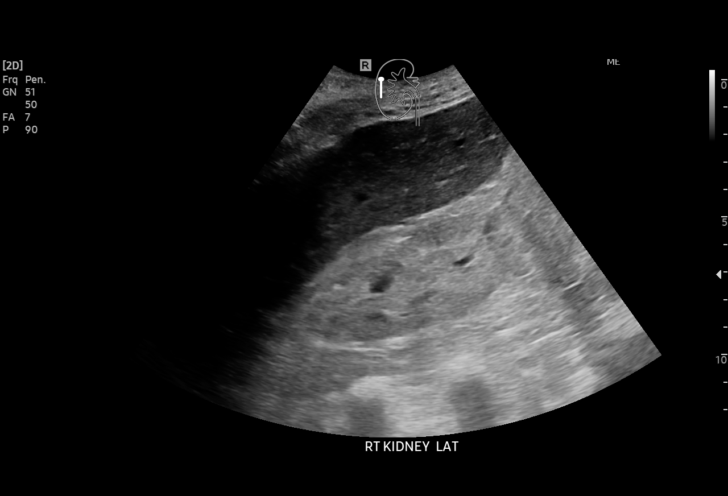
[im 8/28]
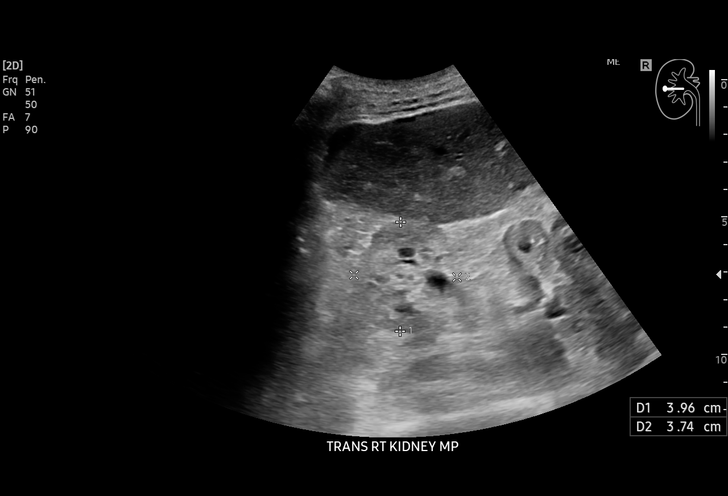
[im 11/28]
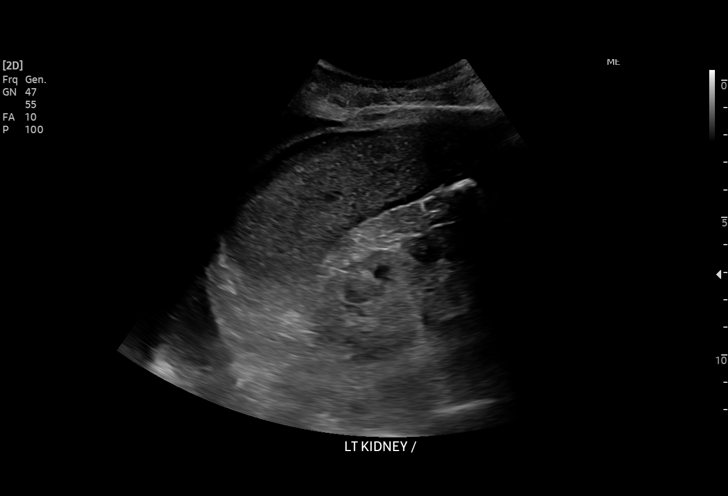
[im 12/28]
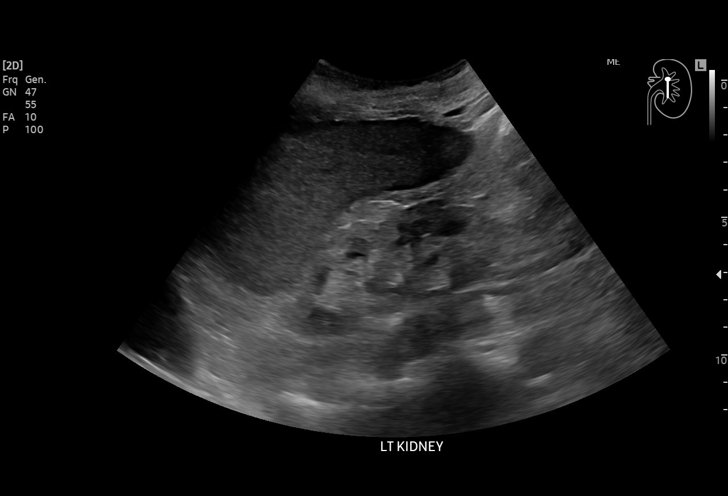
[im 14/28]
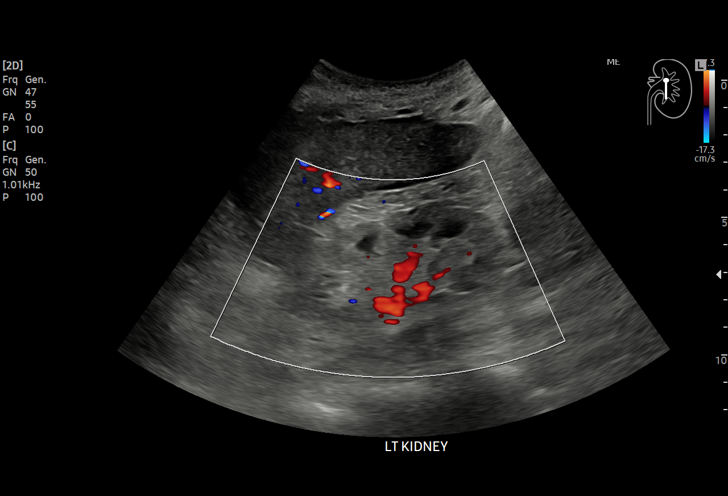
[im 16/28]
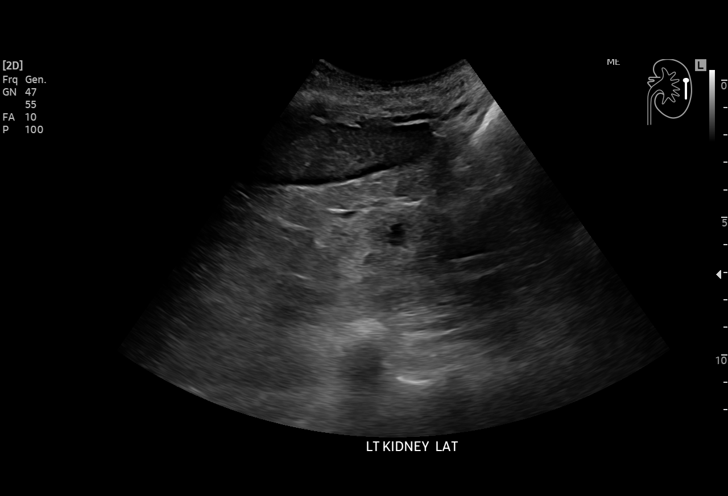
[im 17/28]
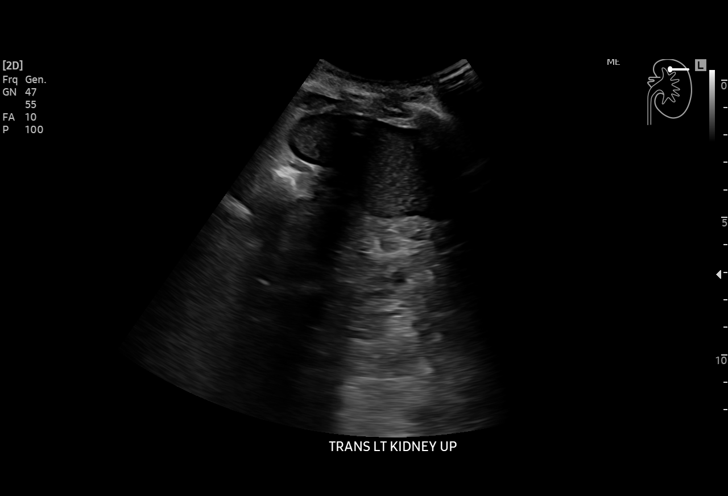
[im 20/28]
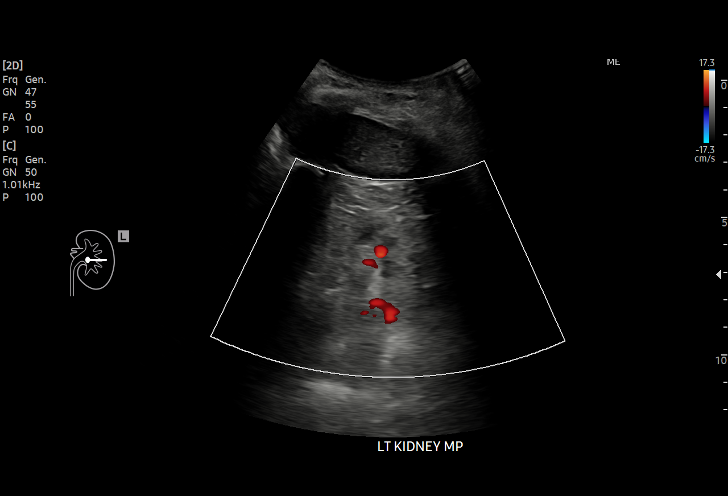
[im 22/28]
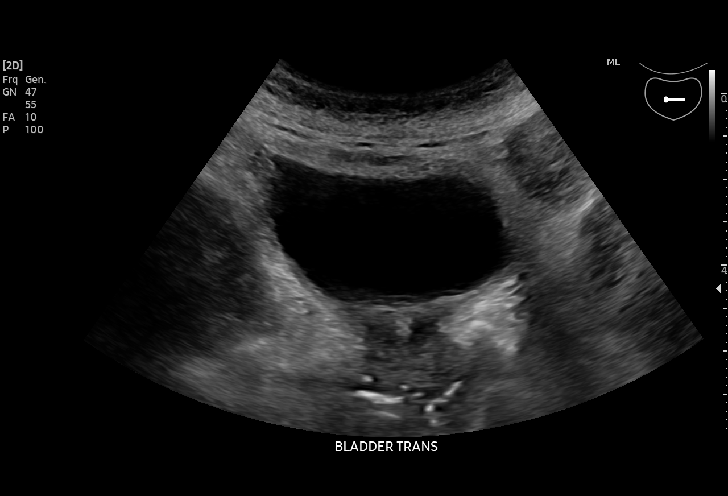
[im 23/28]
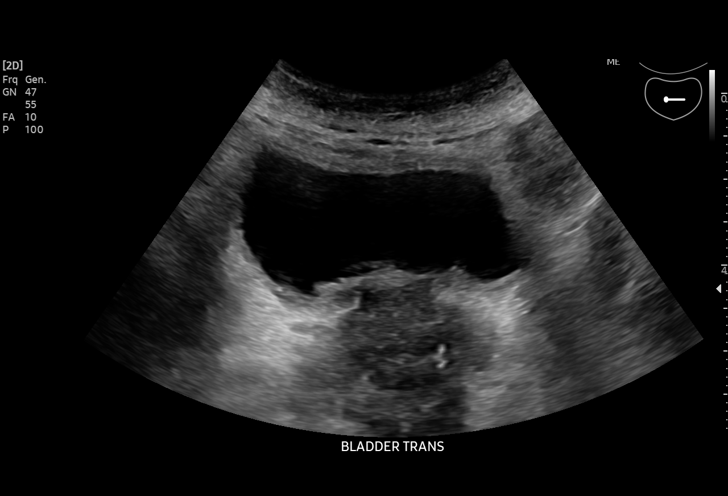
[im 25/28]
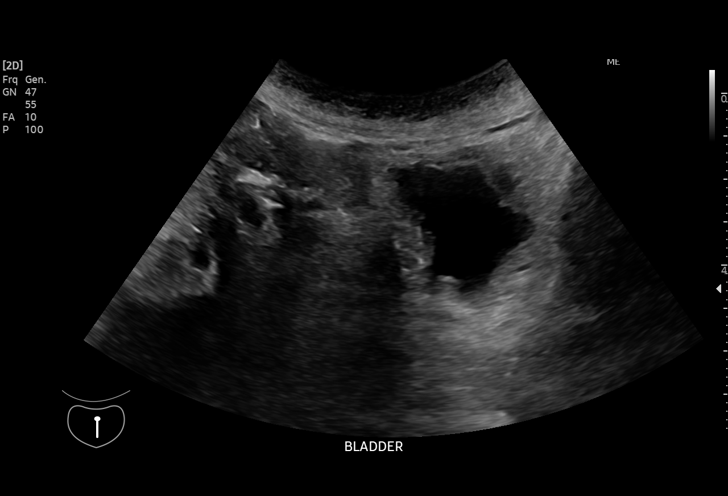
[im 28/28]
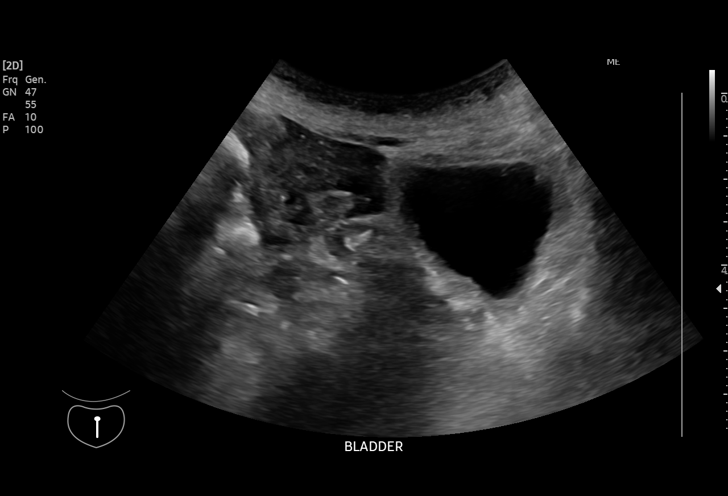

[15 of 25 positions shown; findings below may reference images not displayed]

FINDINGS: Right Kidney:

Renal measurements: 9.4 x 4.0 x 3.7 cm = volume: 74 mL. Echogenic
right renal cortex (image 2). No right hydronephrosis or renal mass.

Left Kidney:

Renal measurements: 8.6 x 4.5 x 4.2 cm = volume: 88 mL. Echogenic
left renal cortex (image 14). No left hydronephrosis or renal mass.

Bladder:

Appears normal for degree of bladder distention. No urinary debris
is evident.

Other:

Trace free fluid around the spleen (image 12).
IMPRESSION: 1. Echogenic kidneys in keeping with chronic medical renal disease.
No acute renal finding.
2. Trace perisplenic ascites.
